# Patient Record
Sex: Male | Born: 1945
Health system: Southern US, Community
[De-identification: ages and names within clinical notes are randomized; demographics above are authoritative.]

## PROBLEM LIST (undated history)

## (undated) DIAGNOSIS — I10 Essential (primary) hypertension: Secondary | ICD-10-CM

## (undated) DIAGNOSIS — I2510 Atherosclerotic heart disease of native coronary artery without angina pectoris: Secondary | ICD-10-CM

## (undated) DIAGNOSIS — N529 Male erectile dysfunction, unspecified: Secondary | ICD-10-CM

## (undated) DIAGNOSIS — E785 Hyperlipidemia, unspecified: Secondary | ICD-10-CM

## (undated) DIAGNOSIS — L80 Vitiligo: Secondary | ICD-10-CM

## (undated) DIAGNOSIS — I219 Acute myocardial infarction, unspecified: Secondary | ICD-10-CM

## (undated) DIAGNOSIS — M1990 Unspecified osteoarthritis, unspecified site: Secondary | ICD-10-CM

## (undated) HISTORY — DX: Hyperlipidemia, unspecified: E78.5

## (undated) HISTORY — DX: Essential (primary) hypertension: I10

## (undated) HISTORY — DX: Acute myocardial infarction, unspecified: I21.9

## (undated) HISTORY — DX: Unspecified osteoarthritis, unspecified site: M19.90

## (undated) HISTORY — DX: Vitiligo: L80

## (undated) HISTORY — DX: Male erectile dysfunction, unspecified: N52.9

## (undated) HISTORY — DX: Atherosclerotic heart disease of native coronary artery without angina pectoris: I25.10

---

## 2001-02-11 LAB — HM COLONOSCOPY

## 2004-12-23 ENCOUNTER — Ambulatory Visit: Payer: Self-pay | Admitting: Family Medicine

## 2004-12-30 ENCOUNTER — Ambulatory Visit: Payer: Self-pay | Admitting: Family Medicine

## 2005-09-25 ENCOUNTER — Ambulatory Visit: Payer: Self-pay | Admitting: Family Medicine

## 2005-10-02 ENCOUNTER — Ambulatory Visit: Payer: Self-pay

## 2005-10-17 ENCOUNTER — Ambulatory Visit: Payer: Self-pay | Admitting: Family Medicine

## 2006-02-01 ENCOUNTER — Ambulatory Visit: Payer: Self-pay | Admitting: Family Medicine

## 2006-02-26 ENCOUNTER — Ambulatory Visit: Payer: Self-pay | Admitting: Family Medicine

## 2006-06-08 ENCOUNTER — Encounter: Admission: RE | Admit: 2006-06-08 | Discharge: 2006-06-08 | Payer: Self-pay | Admitting: Family Medicine

## 2006-06-08 ENCOUNTER — Ambulatory Visit: Payer: Self-pay | Admitting: Family Medicine

## 2006-06-08 LAB — CONVERTED CEMR LAB
Albumin: 4.2 g/dL (ref 3.5–5.2)
Chloride: 101 meq/L (ref 96–112)
Chol/HDL Ratio, serum: 4.1
Cholesterol: 139 mg/dL (ref 0–200)
GFR calc non Af Amer: 51 mL/min
HDL: 33.7 mg/dL — ABNORMAL LOW (ref >39.0)
PSA: 0.9 ng/mL (ref 0.10–4.00)
Potassium: 4.4 meq/L (ref 3.5–5.1)
Sodium: 139 meq/L (ref 135–145)
Total Bilirubin: 1.2 mg/dL (ref 0.3–1.2)
Total Protein: 6.9 g/dL (ref 6.0–8.3)
VLDL: 13 mg/dL (ref 0–40)

## 2006-07-03 DIAGNOSIS — I219 Acute myocardial infarction, unspecified: Secondary | ICD-10-CM

## 2006-07-03 HISTORY — DX: Acute myocardial infarction, unspecified: I21.9

## 2006-07-14 ENCOUNTER — Inpatient Hospital Stay (HOSPITAL_COMMUNITY): Admission: EM | Admit: 2006-07-14 | Discharge: 2006-07-20 | Payer: Self-pay | Admitting: Cardiology

## 2006-07-14 ENCOUNTER — Encounter: Payer: Self-pay | Admitting: Emergency Medicine

## 2006-07-14 ENCOUNTER — Ambulatory Visit: Payer: Self-pay | Admitting: Cardiology

## 2006-08-08 ENCOUNTER — Ambulatory Visit: Payer: Self-pay | Admitting: Cardiovascular Disease

## 2006-08-17 ENCOUNTER — Encounter (HOSPITAL_COMMUNITY): Admission: RE | Admit: 2006-08-17 | Discharge: 2006-09-28 | Payer: Self-pay | Admitting: Cardiovascular Disease

## 2006-08-31 ENCOUNTER — Ambulatory Visit: Payer: Self-pay | Admitting: Thoracic Surgery (Cardiothoracic Vascular Surgery)

## 2006-09-25 ENCOUNTER — Ambulatory Visit: Payer: Self-pay | Admitting: Cardiovascular Disease

## 2006-09-25 LAB — CONVERTED CEMR LAB
ALT: 20 units/L (ref 0–40)
AST: 25 units/L (ref 0–37)
Alkaline Phosphatase: 56 units/L (ref 39–117)
Bilirubin, Direct: 0.1 mg/dL (ref 0.0–0.3)
Total Bilirubin: 0.9 mg/dL (ref 0.3–1.2)
VLDL: 11 mg/dL (ref 0–40)

## 2006-10-09 ENCOUNTER — Ambulatory Visit: Payer: Self-pay | Admitting: Cardiovascular Disease

## 2006-11-14 ENCOUNTER — Ambulatory Visit: Payer: Self-pay | Admitting: Cardiovascular Disease

## 2007-02-18 DIAGNOSIS — M109 Gout, unspecified: Secondary | ICD-10-CM

## 2007-02-18 DIAGNOSIS — E785 Hyperlipidemia, unspecified: Secondary | ICD-10-CM

## 2007-02-18 DIAGNOSIS — E119 Type 2 diabetes mellitus without complications: Secondary | ICD-10-CM

## 2007-02-18 DIAGNOSIS — I10 Essential (primary) hypertension: Secondary | ICD-10-CM

## 2007-03-26 ENCOUNTER — Ambulatory Visit: Payer: Self-pay | Admitting: Cardiovascular Disease

## 2007-03-26 LAB — CONVERTED CEMR LAB
ALT: 23 units/L (ref 0–53)
AST: 22 units/L (ref 0–37)
Alkaline Phosphatase: 49 units/L (ref 39–117)
Bilirubin, Direct: 0.2 mg/dL (ref 0.0–0.3)
Cholesterol: 108 mg/dL (ref 0–200)
Total CHOL/HDL Ratio: 3.4
Total Protein: 6.7 g/dL (ref 6.0–8.3)
VLDL: 10 mg/dL (ref 0–40)

## 2007-04-03 ENCOUNTER — Ambulatory Visit: Payer: Self-pay | Admitting: Cardiovascular Disease

## 2007-04-04 ENCOUNTER — Telehealth: Payer: Self-pay | Admitting: Family Medicine

## 2007-05-10 ENCOUNTER — Ambulatory Visit: Payer: Self-pay | Admitting: Family Medicine

## 2007-05-10 DIAGNOSIS — N401 Enlarged prostate with lower urinary tract symptoms: Secondary | ICD-10-CM

## 2007-05-10 DIAGNOSIS — I2510 Atherosclerotic heart disease of native coronary artery without angina pectoris: Secondary | ICD-10-CM | POA: Insufficient documentation

## 2007-05-10 DIAGNOSIS — N138 Other obstructive and reflux uropathy: Secondary | ICD-10-CM

## 2007-05-10 LAB — CONVERTED CEMR LAB
Specific Gravity, Urine: 1.025
Urobilinogen, UA: 1
WBC Urine, dipstick: NEGATIVE
pH: 5.5

## 2007-05-13 LAB — CONVERTED CEMR LAB
ALT: 20 units/L (ref 0–53)
AST: 24 units/L (ref 0–37)
Albumin: 4.4 g/dL (ref 3.5–5.2)
Alkaline Phosphatase: 57 units/L (ref 39–117)
BUN: 22 mg/dL (ref 6–23)
Basophils Absolute: 0 10*3/uL (ref 0.0–0.1)
Basophils Relative: 0.4 % (ref 0.0–1.0)
CO2: 30 meq/L (ref 19–32)
Calcium: 9.9 mg/dL (ref 8.4–10.5)
Cholesterol: 115 mg/dL (ref 0–200)
Creatinine, Ser: 1.3 mg/dL (ref 0.4–1.5)
Creatinine,U: 300 mg/dL
Eosinophils Absolute: 0.3 10*3/uL (ref 0.0–0.6)
HCT: 42.8 % (ref 39.0–52.0)
Hemoglobin: 15.1 g/dL (ref 13.0–17.0)
Hgb A1c MFr Bld: 5.5 % (ref 4.6–6.0)
LDL Cholesterol: 72 mg/dL (ref 0–99)
Lymphocytes Relative: 17.4 % (ref 12.0–46.0)
Microalb Creat Ratio: 3.3 mg/g (ref 0.0–30.0)
Microalb, Ur: 1 mg/dL (ref 0.0–1.9)
Monocytes Absolute: 0.6 10*3/uL (ref 0.2–0.7)
Neutro Abs: 4.6 10*3/uL (ref 1.4–7.7)
Platelets: 216 10*3/uL (ref 150–400)
RDW: 14.5 % (ref 11.5–14.6)
Total Bilirubin: 1.1 mg/dL (ref 0.3–1.2)
Total CHOL/HDL Ratio: 3.7

## 2007-07-04 HISTORY — PX: OTHER SURGICAL HISTORY: SHX169

## 2007-11-06 ENCOUNTER — Ambulatory Visit: Payer: Self-pay | Admitting: Cardiovascular Disease

## 2008-03-13 ENCOUNTER — Ambulatory Visit: Payer: Self-pay | Admitting: Cardiovascular Disease

## 2008-03-13 LAB — CONVERTED CEMR LAB
ALT: 18 units/L (ref 0–53)
AST: 21 units/L (ref 0–37)
Bilirubin, Direct: 0.2 mg/dL (ref 0.0–0.3)
Cholesterol: 107 mg/dL (ref 0–200)
Total Bilirubin: 1.1 mg/dL (ref 0.3–1.2)
Total CHOL/HDL Ratio: 4.3
VLDL: 14 mg/dL (ref 0–40)

## 2008-05-29 IMAGING — CR DG CHEST 2V
2 series · 2 of 2 positions shown · non-contrast
Comparison: 07/17/06.

CLINICAL DATA: CABG.
 CHEST - 2 VIEW:

[w chest pa]
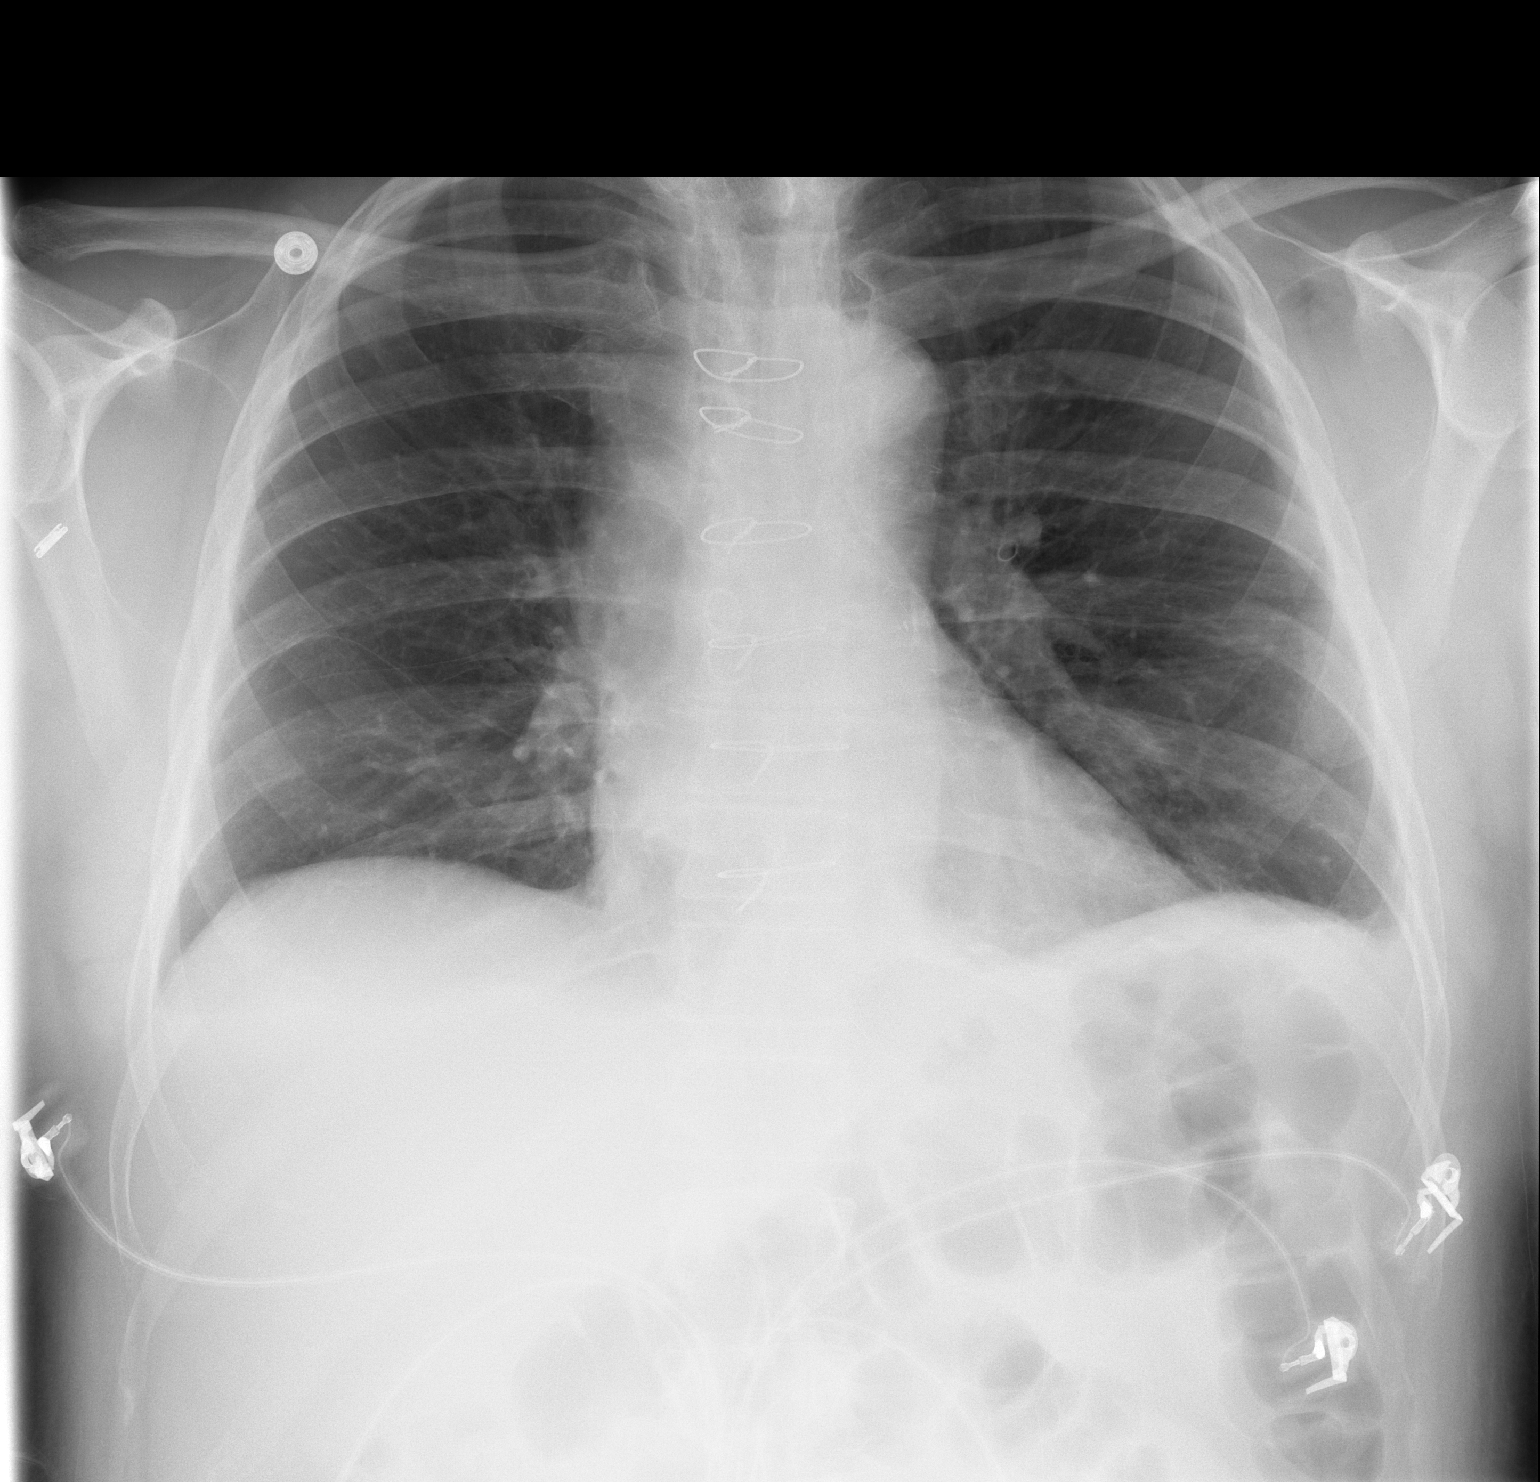

[w chest lat]
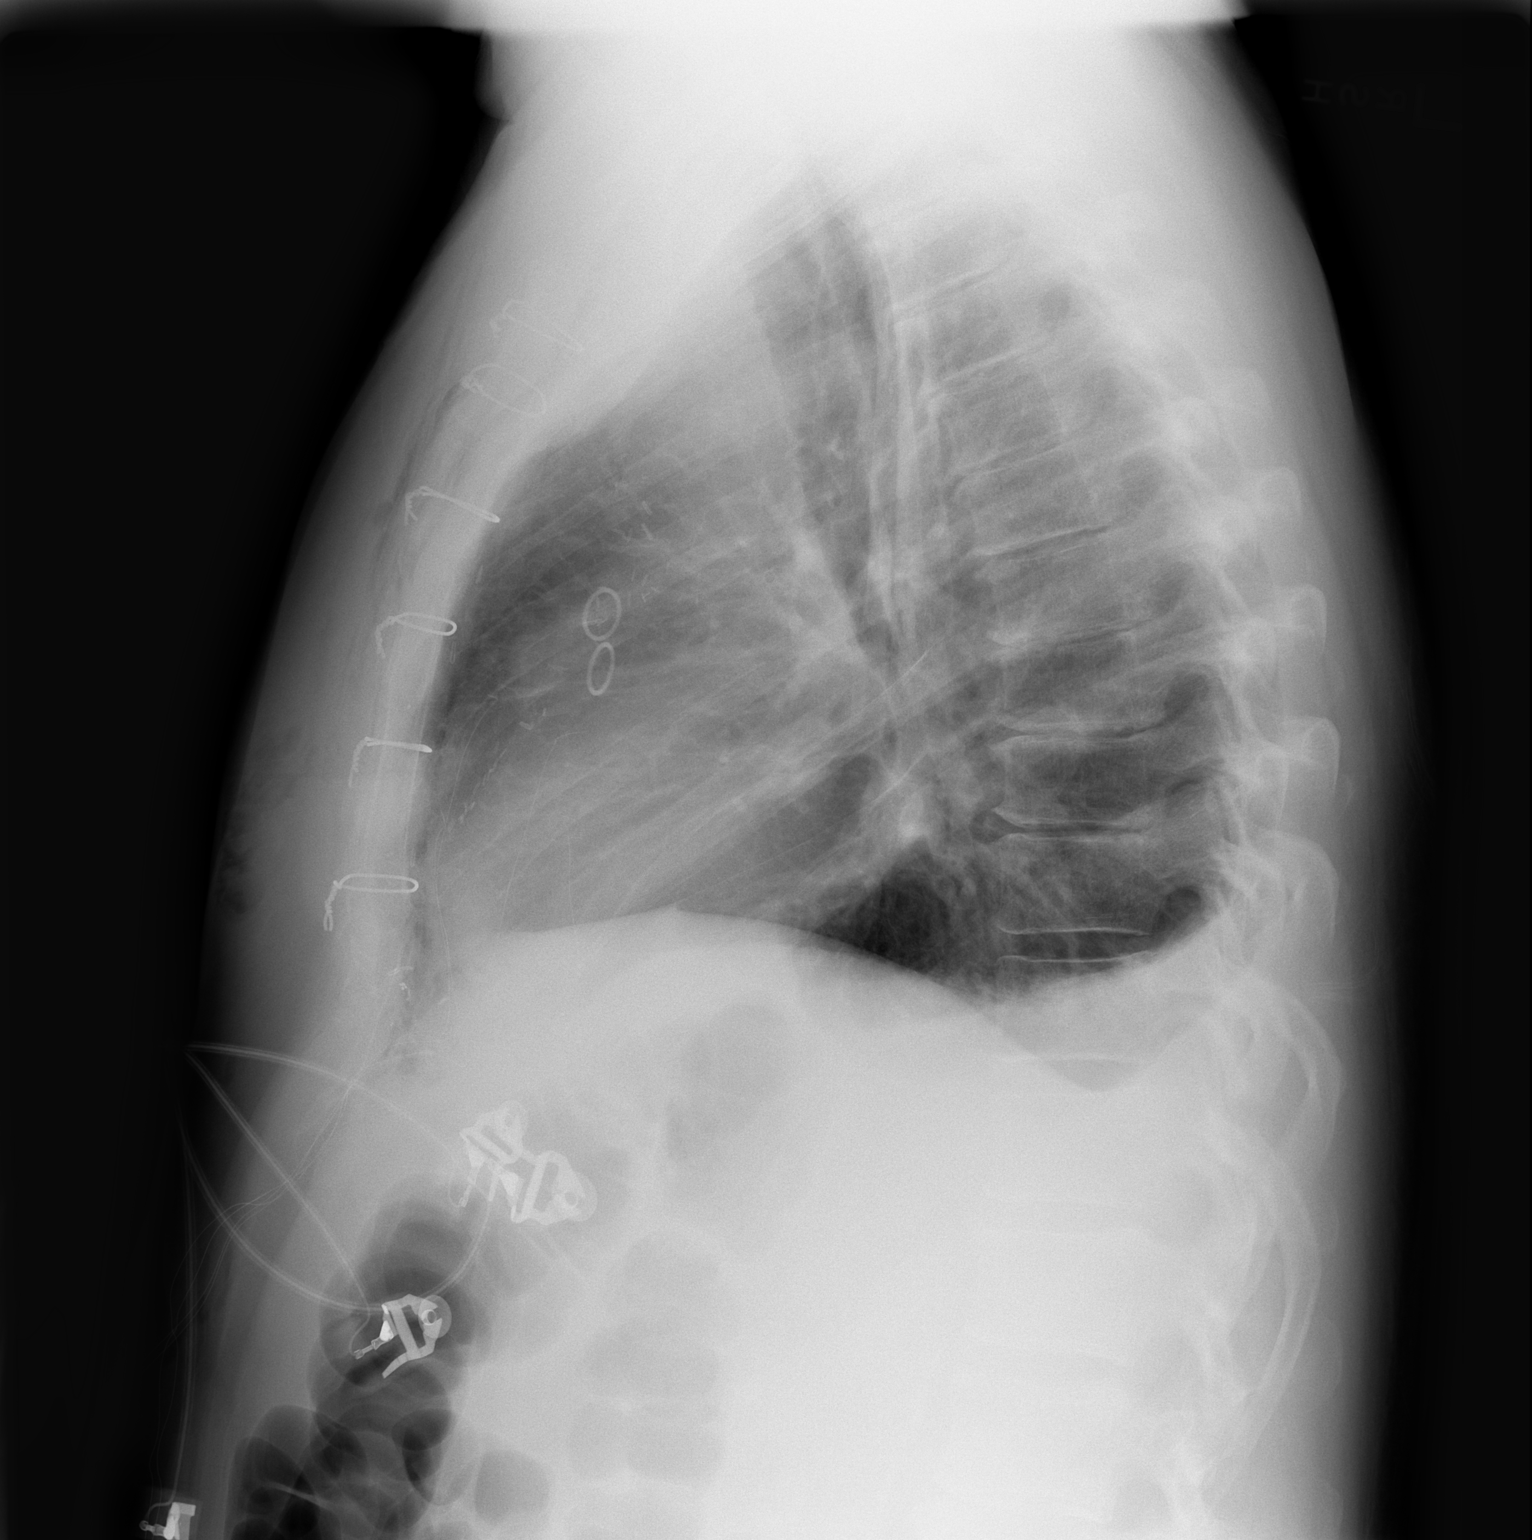

[2 of 2 positions shown; findings below may reference images not displayed]

FINDINGS: The right central venous line and left chest tube have been removed. Small effusions remain with mild basilar atelectasis. No pneumothorax is seen.  A minimal amount of chest wall subcutaneous air is present.
IMPRESSION: Swan-Ganz catheter and left chest tube removed. No pneumothorax. Small effusions.

## 2008-07-10 ENCOUNTER — Ambulatory Visit: Payer: Self-pay | Admitting: Family Medicine

## 2008-07-10 LAB — CONVERTED CEMR LAB
Glucose, Urine, Semiquant: NEGATIVE
Nitrite: NEGATIVE
Protein, U semiquant: NEGATIVE
Specific Gravity, Urine: 1.02
Urobilinogen, UA: 0.2

## 2008-07-14 LAB — CONVERTED CEMR LAB
ALT: 19 units/L (ref 0–53)
AST: 23 units/L (ref 0–37)
Albumin: 4.3 g/dL (ref 3.5–5.2)
Alkaline Phosphatase: 53 units/L (ref 39–117)
Basophils Relative: 0 % (ref 0.0–3.0)
Chloride: 107 meq/L (ref 96–112)
Eosinophils Absolute: 0.2 10*3/uL (ref 0.0–0.7)
Eosinophils Relative: 2.8 % (ref 0.0–5.0)
GFR calc Af Amer: 79 mL/min
GFR calc non Af Amer: 65 mL/min
HCT: 43.4 % (ref 39.0–52.0)
Hemoglobin: 15.3 g/dL (ref 13.0–17.0)
LDL Cholesterol: 64 mg/dL (ref 0–99)
MCV: 84.3 fL (ref 78.0–100.0)
Neutro Abs: 4.7 10*3/uL (ref 1.4–7.7)
Potassium: 5.2 meq/L — ABNORMAL HIGH (ref 3.5–5.1)
RDW: 13.8 % (ref 11.5–14.6)
Sodium: 144 meq/L (ref 135–145)
Total Bilirubin: 1.3 mg/dL — ABNORMAL HIGH (ref 0.3–1.2)
Total Protein: 7.5 g/dL (ref 6.0–8.3)
Triglycerides: 86 mg/dL (ref 0–149)
VLDL: 17 mg/dL (ref 0–40)
WBC: 6.7 10*3/uL (ref 4.5–10.5)

## 2008-07-17 ENCOUNTER — Ambulatory Visit: Payer: Self-pay | Admitting: Family Medicine

## 2008-07-17 DIAGNOSIS — I252 Old myocardial infarction: Secondary | ICD-10-CM

## 2008-10-05 ENCOUNTER — Telehealth: Payer: Self-pay | Admitting: Family Medicine

## 2008-10-16 ENCOUNTER — Ambulatory Visit: Payer: Self-pay | Admitting: Family Medicine

## 2008-10-19 ENCOUNTER — Telehealth: Payer: Self-pay | Admitting: Cardiovascular Disease

## 2008-10-20 ENCOUNTER — Encounter: Payer: Self-pay | Admitting: Cardiovascular Disease

## 2008-10-22 ENCOUNTER — Telehealth: Payer: Self-pay | Admitting: Cardiovascular Disease

## 2008-12-02 ENCOUNTER — Telehealth: Payer: Self-pay | Admitting: Family Medicine

## 2009-02-18 ENCOUNTER — Ambulatory Visit: Payer: Self-pay | Admitting: Cardiovascular Disease

## 2009-03-15 ENCOUNTER — Ambulatory Visit: Payer: Self-pay

## 2009-03-15 ENCOUNTER — Ambulatory Visit: Payer: Self-pay | Admitting: Cardiovascular Disease

## 2009-06-08 ENCOUNTER — Telehealth (INDEPENDENT_AMBULATORY_CARE_PROVIDER_SITE_OTHER): Payer: Self-pay | Admitting: *Deleted

## 2009-07-28 ENCOUNTER — Ambulatory Visit: Payer: Self-pay | Admitting: Family Medicine

## 2009-07-29 LAB — CONVERTED CEMR LAB
AST: 25 units/L (ref 0–37)
Alkaline Phosphatase: 46 units/L (ref 39–117)
BUN: 18 mg/dL (ref 6–23)
Basophils Absolute: 0 10*3/uL (ref 0.0–0.1)
Bilirubin, Direct: 0.1 mg/dL (ref 0.0–0.3)
CO2: 28 meq/L (ref 19–32)
Cholesterol: 109 mg/dL (ref 0–200)
Eosinophils Absolute: 0.2 10*3/uL (ref 0.0–0.7)
GFR calc non Af Amer: 64.78 mL/min (ref >60)
HCT: 44.8 % (ref 39.0–52.0)
Hemoglobin: 14.6 g/dL (ref 13.0–17.0)
Lymphs Abs: 1.2 10*3/uL (ref 0.7–4.0)
Microalb Creat Ratio: 2.4 mg/g (ref 0.0–30.0)
Microalb, Ur: 0.4 mg/dL (ref 0.0–1.9)
Monocytes Absolute: 0.5 10*3/uL (ref 0.1–1.0)
Monocytes Relative: 10 % (ref 3.0–12.0)
Nitrite: NEGATIVE
PSA: 0.74 ng/mL (ref 0.10–4.00)
Potassium: 4.2 meq/L (ref 3.5–5.1)
RBC: 5.05 M/uL (ref 4.22–5.81)
Specific Gravity, Urine: 1.015
TSH: 2.58 microintl units/mL (ref 0.35–5.50)
Total Bilirubin: 0.9 mg/dL (ref 0.3–1.2)
Triglycerides: 72 mg/dL (ref 0.0–149.0)
Urobilinogen, UA: 0.2
VLDL: 14.4 mg/dL (ref 0.0–40.0)
WBC: 4.9 10*3/uL (ref 4.5–10.5)
pH: 5.5

## 2009-08-04 ENCOUNTER — Ambulatory Visit: Payer: Self-pay | Admitting: Family Medicine

## 2009-08-04 DIAGNOSIS — R3129 Other microscopic hematuria: Secondary | ICD-10-CM

## 2009-09-14 ENCOUNTER — Encounter: Payer: Self-pay | Admitting: Family Medicine

## 2009-10-06 ENCOUNTER — Encounter: Payer: Self-pay | Admitting: Family Medicine

## 2009-11-09 ENCOUNTER — Ambulatory Visit: Payer: Self-pay | Admitting: Family Medicine

## 2010-01-24 ENCOUNTER — Telehealth: Payer: Self-pay | Admitting: Cardiovascular Disease

## 2010-02-16 ENCOUNTER — Ambulatory Visit: Payer: Self-pay | Admitting: Family Medicine

## 2010-02-16 DIAGNOSIS — B360 Pityriasis versicolor: Secondary | ICD-10-CM

## 2010-02-22 ENCOUNTER — Ambulatory Visit: Payer: Self-pay | Admitting: Cardiovascular Disease

## 2010-08-02 NOTE — Letter (Signed)
Summary: [facility]  [facility]   Imported By: Maryln Gottron 10/13/2009 09:36:54  _____________________________________________________________________  External Attachment:    Type:   Image     Comment:   External Document

## 2010-08-02 NOTE — Assessment & Plan Note (Signed)
Summary: f1y   Visit Type:  1 year follow up Primary Provider:  Nelwyn Salisbury MD  CC:  No cardiac complaints.  History of Present Illness: 65 year-old with CAD s/p CABG 2007. He presented with ACS and was found to have critical left main stenosis. He has done very well since CABG. He continues with regular exercise and no exertional symptoms. Specifically denies chest pain, dyspnea, or edema. Tolerating medical therapy well.  Current Medications (verified): 1)  Naproxen Dr 500 Mg  Tbec (Naproxen) .... Two Times A Day As Needed For Pain 2)  Crestor 10 Mg Tabs (Rosuvastatin Calcium) .... Take 1 Tablet By Mouth Once A Day 3)  Metoprolol Tartrate 50 Mg Tabs (Metoprolol Tartrate) .Marland Kitchen.. 1 By Mouth Two Times A Day 4)  Cialis 20 Mg Tabs (Tadalafil) .... As Needed 5)  Allopurinol 300 Mg Tabs (Allopurinol) .... Take 1 Tab Every Day 6)  Centrum  Tabs (Multiple Vitamins-Minerals) .... Take 1 Tablet By Mouth Once A Day 7)  Aspirin 81 Mg Tbec (Aspirin) .... Take One Tablet By Mouth Daily 8)  Indomethacin 50 Mg Caps (Indomethacin) .Marland Kitchen.. 1 Q 6 Hours As Needed Gout 9)  Ketoconazole 200 Mg Tabs (Ketoconazole) .... Two Times A Day For 1 Month  Allergies (verified): No Known Drug Allergies  Past History:  Past medical history reviewed for relevance to current acute and chronic problems.  Past Medical History: Reviewed history from 02/21/2010 and no changes required.  Coronary artery disease status post CABG.  New right bundle branch   block noted but no symptomatic concerns for ischemia. DJD - Neck Gout Hyperlipidemia Diabetes mellitus, type II Hypertension Erectile Dysfunction Myocardial infarction, hx of, 1-08 vitiligo normal treadmill 03-15-09  Review of Systems       Negative except as per HPI   Vital Signs:  Patient profile:   65 year old male Height:      69 inches Weight:      196.25 pounds BMI:     29.09 Pulse rate:   56 / minute Pulse rhythm:   regular Resp:     18 per  minute BP sitting:   132 / 70  (left arm) Cuff size:   large  Vitals Entered By: Vikki Ports (February 22, 2010 11:58 AM)  Physical Exam  General:  Pt is alert and oriented, in no acute distress. HEENT: normal Neck: normal carotid upstrokes without bruits, JVP normal Lungs: CTA CV: RRR without murmur or gallop Abd: soft, NT, positive BS, no bruit, no organomegaly Ext: no clubbing, cyanosis, or edema. peripheral pulses 2+ and equal Skin: warm and dry without rash    EKG  Procedure date:  02/22/2010  Findings:      Sinus brady 56 bpm, RBBB, unchanged from previous  Impression & Recommendations:  Problem # 1:  CORONARY ARTERY DISEASE (ICD-414.00) Stable without angina. Reviewed exercise tolerance test from last year and he had no angina or ST changes at over 10 mets.  His updated medication list for this problem includes:    Metoprolol Tartrate 50 Mg Tabs (Metoprolol tartrate) .Marland Kitchen... 1 by mouth two times a day    Aspirin 81 Mg Tbec (Aspirin) .Marland Kitchen... Take one tablet by mouth daily  Orders: EKG w/ Interpretation (93000)  Problem # 2:  HYPERLIPIDEMIA (ICD-272.4) Lipids at goal as below.  His updated medication list for this problem includes:    Crestor 10 Mg Tabs (Rosuvastatin calcium) .Marland Kitchen... Take 1 tablet by mouth once a day  CHOL: 109 (07/28/2009)  LDL: 54 (07/28/2009)   HDL: 41.00 (07/28/2009)   TG: 72.0 (07/28/2009)  Problem # 3:  HYPERTENSION (ICD-401.9) Controlled.  His updated medication list for this problem includes:    Metoprolol Tartrate 50 Mg Tabs (Metoprolol tartrate) .Marland Kitchen... 1 by mouth two times a day    Aspirin 81 Mg Tbec (Aspirin) .Marland Kitchen... Take one tablet by mouth daily  BP today: 132/70 Prior BP: 106/68 (02/16/2010)  Prior 10 Yr Risk Heart Disease: N/A (03/15/2009)  Labs Reviewed: K+: 4.2 (07/28/2009) Creat: : 1.2 (07/28/2009)   Chol: 109 (07/28/2009)   HDL: 41.00 (07/28/2009)   LDL: 54 (07/28/2009)   TG: 72.0 (07/28/2009)  Patient Instructions: 1)   Your physician recommends that you continue on your current medications as directed. Please refer to the Current Medication list given to you today. 2)  Your physician wants you to follow-up in:  1 YEAR.  You will receive a reminder letter in the mail two months in advance. If you don't receive a letter, please call our office to schedule the follow-up appointment.

## 2010-08-02 NOTE — Assessment & Plan Note (Signed)
Summary: CPX/NJR   Vital Signs:  Patient profile:   65 year old male Height:      69 inches Weight:      198 pounds BMI:     29.35 Temp:     97.9 degrees F oral Pulse rate:   58 / minute BP sitting:   112 / 80  (left arm) Cuff size:   large  Vitals Entered By: Alfred Levins, CMA (August 04, 2009 1:33 PM) CC: cpx   History of Present Illness: 65 yr old male for cpx. He feels fine and he has no concerns. He is still active and exercises regularly. No gout attacks in the past year. He plans to go on a [place] cruise in a few weeks.   Current Medications (verified): 1)  Naproxen Dr 500 Mg  Tbec (Naproxen) .... Two Times A Day As Needed 2)  Crestor 10 Mg Tabs (Rosuvastatin Calcium) .... Take 1 Tablet By Mouth Once A Day 3)  Metoprolol Tartrate 50 Mg Tabs (Metoprolol Tartrate) .Marland Kitchen.. 1 By Mouth Two Times A Day 4)  Cialis 20 Mg Tabs (Tadalafil) .... As Needed 5)  Allopurinol 300 Mg Tabs (Allopurinol) .... Take 1 Tab Every Day 6)  Centrum  Tabs (Multiple Vitamins-Minerals) .... Take 1 Tablet By Mouth Once A Day 7)  Aspirin 81 Mg Tbec (Aspirin) .... Take One Tablet By Mouth Daily  Allergies (verified): No Known Drug Allergies  Past History:  Past Medical History: DJD - Neck Gout Hyperlipidemia Diabetes mellitus, type II Hypertension Erectile Dysfunction Coronary artery disease (sees Dr. Excell Seltzer) Myocardial infarction, hx of, 1-08 vitiligo normal treadmill 03-15-09  Past Surgical History: Rotator cuff repair Triple cardiac Bypass 1-08 colonoscopy 02-11-01 per Dr. Russella Dar, repeat 10 yrs  Family History: Reviewed history from 09/16/2008 and no changes required. Family History of Alcoholism/Addiction Family History Hypertension Family History Lung cancer Family History of Stroke M 1st degree relative <50 Family History of Cardiovascular disorder   His mother died at 59 from congestive heart failure,  unclear whether she had a history of myocardial infarction.  Father  died  at age 67 from lung cancer but reportedly had myocardial infarction at  age 67.  Social History: Reviewed history from 02/18/2007 and no changes required. Never Smoked Alcohol use-yes Drug use-no Married  Review of Systems  The patient denies anorexia, fever, weight loss, weight gain, vision loss, decreased hearing, hoarseness, chest pain, syncope, dyspnea on exertion, peripheral edema, prolonged cough, headaches, hemoptysis, abdominal pain, melena, hematochezia, severe indigestion/heartburn, hematuria, incontinence, genital sores, muscle weakness, suspicious skin lesions, transient blindness, difficulty walking, depression, unusual weight change, abnormal bleeding, enlarged lymph nodes, angioedema, breast masses, and testicular masses.    Physical Exam  General:  Well-developed,well-nourished,in no acute distress; alert,appropriate and cooperative throughout examination Head:  Normocephalic and atraumatic without obvious abnormalities. No apparent alopecia or balding. Eyes:  No corneal or conjunctival inflammation noted. EOMI. Perrla. Funduscopic exam benign, without hemorrhages, exudates or papilledema. Vision grossly normal. Ears:  External ear exam shows no significant lesions or deformities.  Otoscopic examination reveals clear canals, tympanic membranes are intact bilaterally without bulging, retraction, inflammation or discharge. Hearing is grossly normal bilaterally. Nose:  External nasal examination shows no deformity or inflammation. Nasal mucosa are pink and moist without lesions or exudates. Mouth:  Oral mucosa and oropharynx without lesions or exudates.  Teeth in good repair. Neck:  No deformities, masses, or tenderness noted. Chest Wall:  No deformities, masses, tenderness or gynecomastia noted. Lungs:  Normal respiratory effort, chest  expands symmetrically. Lungs are clear to auscultation, no crackles or wheezes. Heart:  Normal rate and regular rhythm. S1 and S2 normal  without gallop, murmur, click, rub or other extra sounds. EKG stable with RBBB. Abdomen:  Bowel sounds positive,abdomen soft and non-tender without masses, organomegaly or hernias noted. Rectal:  No external abnormalities noted. Normal sphincter tone. No rectal masses or tenderness. Heme neg Genitalia:  Testes bilaterally descended without nodularity, tenderness or masses. No scrotal masses or lesions. No penis lesions or urethral discharge. Prostate:  Prostate gland firm and smooth, no enlargement, nodularity, tenderness, mass, asymmetry or induration. Msk:  No deformity or scoliosis noted of thoracic or lumbar spine.   Pulses:  R and L carotid,radial,femoral,dorsalis pedis and posterior tibial pulses are full and equal bilaterally Extremities:  No clubbing, cyanosis, edema, or deformity noted with normal full range of motion of all joints.   Neurologic:  No cranial nerve deficits noted. Station and gait are normal. Plantar reflexes are down-going bilaterally. DTRs are symmetrical throughout. Sensory, motor and coordinative functions appear intact. Skin:  Intact without suspicious lesions or rashes Cervical Nodes:  No lymphadenopathy noted Axillary Nodes:  No palpable lymphadenopathy Inguinal Nodes:  No significant adenopathy Psych:  Cognition and judgment appear intact. Alert and cooperative with normal attention span and concentration. No apparent delusions, illusions, hallucinations   Impression & Recommendations:  Problem # 1:  WELL ADULT EXAM (ICD-V70.0)  Orders: Hemoccult Guaiac-1 spec.(in office) (82270) EKG w/ Interpretation (93000)  Problem # 2:  MICROSCOPIC HEMATURIA (ICD-599.72)  Complete Medication List: 1)  Naproxen Dr 500 Mg Tbec (Naproxen) .... Two times a day as needed 2)  Crestor 10 Mg Tabs (Rosuvastatin calcium) .... Take 1 tablet by mouth once a day 3)  Metoprolol Tartrate 50 Mg Tabs (Metoprolol tartrate) .Marland Kitchen.. 1 by mouth two times a day 4)  Cialis 20 Mg Tabs  (Tadalafil) .... As needed 5)  Allopurinol 300 Mg Tabs (Allopurinol) .... Take 1 tab every day 6)  Centrum Tabs (Multiple vitamins-minerals) .... Take 1 tablet by mouth once a day 7)  Aspirin 81 Mg Tbec (Aspirin) .... Take one tablet by mouth daily  Patient Instructions: 1)  He seems to be doing quite well. He will see Urology to evaluate the hematuria.  2)  Please schedule a follow-up appointment in 6 months .  Prescriptions: CIALIS 20 MG TABS (TADALAFIL) as needed  #10 x 11   Entered and Authorized by:   Nelwyn Salisbury MD   Signed by:   Nelwyn Salisbury MD on 08/04/2009   Method used:   Electronically to        [facility]  [phone]* (retail)       [address] [place], [place]  [address]       Ph: [phone] or [phone]       Fax: [phone]   RxID:   [id number]   Preventive Care Screening  Colonoscopy:    Date:  07/04/1999    Next Due:  07/2009    Results:  normal   Last Flu Shot:    Date:  06/02/2009    Results:  given

## 2010-08-02 NOTE — Assessment & Plan Note (Signed)
Summary: RASH ACROSS CHEST // RS   Vital Signs:  Patient profile:   65 year old male Weight:      192 pounds BMI:     28.46 Temp:     98.1 degrees F oral BP sitting:   106 / 68  (left arm) Cuff size:   regular  Vitals Entered By: Raechel Ache, RN (February 16, 2010 9:30 AM) CC: C/o rash on chest x 1 month.   History of Present Illness: here for one month of an asymptomatic rash on the trunk  Allergies (verified): No Known Drug Allergies  Past History:  Past Medical History: Reviewed history from 08/04/2009 and no changes required. DJD - Neck Gout Hyperlipidemia Diabetes mellitus, type II Hypertension Erectile Dysfunction Coronary artery disease (sees Dr. Excell Seltzer) Myocardial infarction, hx of, 1-08 vitiligo normal treadmill 03-15-09  Review of Systems  The patient denies anorexia, fever, weight loss, weight gain, vision loss, decreased hearing, hoarseness, chest pain, syncope, dyspnea on exertion, peripheral edema, prolonged cough, headaches, hemoptysis, abdominal pain, melena, hematochezia, severe indigestion/heartburn, hematuria, incontinence, genital sores, muscle weakness, suspicious skin lesions, transient blindness, difficulty walking, depression, unusual weight change, abnormal bleeding, enlarged lymph nodes, angioedema, breast masses, and testicular masses.    Physical Exam  General:  Well-developed,well-nourished,in no acute distress; alert,appropriate and cooperative throughout examination Skin:  scattered patches of hypopigmented skin around the trunk   Impression & Recommendations:  Problem # 1:  TINEA VERSICOLOR (ICD-111.0)  His updated medication list for this problem includes:    Ketoconazole 200 Mg Tabs (Ketoconazole) .Marland Kitchen..Marland Kitchen Two times a day  Complete Medication List: 1)  Naproxen Dr 500 Mg Tbec (Naproxen) .... Two times a day as needed for pain 2)  Crestor 10 Mg Tabs (Rosuvastatin calcium) .... Take 1 tablet by mouth once a day 3)  Metoprolol  Tartrate 50 Mg Tabs (Metoprolol tartrate) .Marland Kitchen.. 1 by mouth two times a day 4)  Cialis 20 Mg Tabs (Tadalafil) .... As needed 5)  Allopurinol 300 Mg Tabs (Allopurinol) .... Take 1 tab every day 6)  Centrum Tabs (Multiple vitamins-minerals) .... Take 1 tablet by mouth once a day 7)  Aspirin 81 Mg Tbec (Aspirin) .... Take one tablet by mouth daily 8)  Indomethacin 50 Mg Caps (Indomethacin) .Marland Kitchen.. 1 q 6 hours as needed gout 9)  Ketoconazole 200 Mg Tabs (Ketoconazole) .... Two times a day  Patient Instructions: 1)  Add topical Selsun Blue.  2)  Please schedule a follow-up appointment as needed .  Prescriptions: KETOCONAZOLE 200 MG TABS (KETOCONAZOLE) two times a day  #60 x 0   Entered and Authorized by:   Nelwyn Salisbury MD   Signed by:   Nelwyn Salisbury MD on 02/16/2010   Method used:   Electronically to        [facility]  [phone]* (retail)       [address] [place], [place]  [address]       Ph: [phone] or [phone]       Fax: [phone]   RxID:   [id number]

## 2010-08-02 NOTE — Letter (Signed)
Summary: [facility]  [facility]   Imported By: Maryln Gottron 09/17/2009 12:44:31  _____________________________________________________________________  External Attachment:    Type:   Image     Comment:   External Document

## 2010-08-02 NOTE — Assessment & Plan Note (Signed)
Summary: knee pain/njr   Vital Signs:  Patient profile:   65 year old male Weight:      198 pounds BMI:     29.35 BP sitting:   120 / 80  (left arm) Cuff size:   regular  Vitals Entered By: Raechel Ache, RN (Nov 09, 2009 8:30 AM) CC: C/o L knee pain x 10 days   History of Present Illness: Here for swelling and pain in the left knee which came up suddenly about 10 days ago. He woke up with it one morning, no hx of trauma. It was hot and red at first. The worst swelling was in the first 5 days, and now it feels muxh better. Using Naprosyn and ice. He usually gets gout in the toes, and only once has he had it in the knees.  Allergies (verified): No Known Drug Allergies  Past History:  Past Medical History: Reviewed history from 08/04/2009 and no changes required. DJD - Neck Gout Hyperlipidemia Diabetes mellitus, type II Hypertension Erectile Dysfunction Coronary artery disease (sees Dr. Excell Seltzer) Myocardial infarction, hx of, 1-08 vitiligo normal treadmill 03-15-09  Past Surgical History: Reviewed history from 08/04/2009 and no changes required. Rotator cuff repair Triple cardiac Bypass 1-08 colonoscopy 02-11-01 per Dr. Russella Dar, repeat 10 yrs  Review of Systems  The patient denies anorexia, fever, weight loss, weight gain, vision loss, decreased hearing, hoarseness, chest pain, syncope, dyspnea on exertion, peripheral edema, prolonged cough, headaches, hemoptysis, abdominal pain, melena, hematochezia, severe indigestion/heartburn, hematuria, incontinence, genital sores, muscle weakness, suspicious skin lesions, transient blindness, difficulty walking, depression, unusual weight change, abnormal bleeding, enlarged lymph nodes, angioedema, breast masses, and testicular masses.    Physical Exam  General:  Well-developed,well-nourished,in no acute distress; alert,appropriate and cooperative throughout examination Msk:  left knee is mildly swollen, not red or warm. Full ROM. Very  tender over the lateral joint space   Impression & Recommendations:  Problem # 1:  GOUT (ICD-274.9)  His updated medication list for this problem includes:    Naproxen Dr 500 Mg Tbec (Naproxen) .Marland Kitchen..Marland Kitchen Two times a day as needed for pain    Allopurinol 300 Mg Tabs (Allopurinol) .Marland Kitchen... Take 1 tab every day    Indomethacin 50 Mg Caps (Indomethacin) .Marland Kitchen... 1 q 6 hours as needed gout  Complete Medication List: 1)  Naproxen Dr 500 Mg Tbec (Naproxen) .... Two times a day as needed for pain 2)  Crestor 10 Mg Tabs (Rosuvastatin calcium) .... Take 1 tablet by mouth once a day 3)  Metoprolol Tartrate 50 Mg Tabs (Metoprolol tartrate) .Marland Kitchen.. 1 by mouth two times a day 4)  Cialis 20 Mg Tabs (Tadalafil) .... As needed 5)  Allopurinol 300 Mg Tabs (Allopurinol) .... Take 1 tab every day 6)  Centrum Tabs (Multiple vitamins-minerals) .... Take 1 tablet by mouth once a day 7)  Aspirin 81 Mg Tbec (Aspirin) .... Take one tablet by mouth daily 8)  Indomethacin 50 Mg Caps (Indomethacin) .Marland Kitchen.. 1 q 6 hours as needed gout  Patient Instructions: 1)  Please schedule a follow-up appointment as needed .  Prescriptions: INDOMETHACIN 50 MG CAPS (INDOMETHACIN) 1 q 6 hours as needed gout  #60 x 5   Entered and Authorized by:   Nelwyn Salisbury MD   Signed by:   Nelwyn Salisbury MD on 11/09/2009   Method used:   [facility] [address]  [phone][facility] (retail)       [address]  [place], [place]  [address]       Ph: [phone] or [phone]       Fax: [phone]   RxID:   [id number] NAPROXEN DR 500 MG  TBEC (NAPROXEN) two times a day as needed for pain  #60 x 11   Entered and Authorized by:   Nelwyn Salisbury MD   Signed by:   Nelwyn Salisbury MD on 11/09/2009   Method used:   [facility] to        [facility]  [phone]* (retail)       [address] [place], [place]  [address]       Ph: [phone] or [phone]       Fax: [phone]   RxID:   [id number]

## 2010-08-02 NOTE — Progress Notes (Signed)
Summary: pt needs refill  Phone Note Refill Request Call back at Home Phone [phone] Message from:  Patient on [facility]  Refills Requested: Medication #1:  METOPROLOL TARTRATE 50 MG TABS 1 by mouth two times a day  Medication #2:  CRESTOR 10 MG TABS Take 1 tablet by mouth once a day Initial call taken by: Omer Jack,  January 24, 2010 1:35 PM  Follow-up for Phone Call        Rx sent into pharmacy. Pt notified. Marrion Coy, CNA  January 24, 2010 1:42 PM  Follow-up by: Marrion Coy, CNA,  January 24, 2010 1:42 PM    Prescriptions: CRESTOR 10 MG TABS (ROSUVASTATIN CALCIUM) Take 1 tablet by mouth once a day  #90 x 1   Entered by:   Marrion Coy, CNA   Authorized by:   Norva Karvonen, MD   Signed by:   Marrion Coy, CNA on 01/24/2010   Method used:   Faxed to ...       [facility] (mail-order)             , [place]         Ph:        Fax: [phone]   RxID:   [id number] METOPROLOL TARTRATE 50 MG TABS (METOPROLOL TARTRATE) 1 by mouth two times a day  #180 x 1   Entered by:   Marrion Coy, CNA   Authorized by:   Norva Karvonen, MD   Signed by:   Marrion Coy, CNA on 01/24/2010   Method used:   Faxed to ...       [facility] (mail-order)             , [place]         Ph:        Fax: [phone]   RxID:   [id number]

## 2010-09-03 ENCOUNTER — Encounter: Payer: Self-pay | Admitting: Family Medicine

## 2010-09-06 ENCOUNTER — Ambulatory Visit (INDEPENDENT_AMBULATORY_CARE_PROVIDER_SITE_OTHER): Payer: Medicare Other | Admitting: Family Medicine

## 2010-09-06 ENCOUNTER — Encounter: Payer: Self-pay | Admitting: Family Medicine

## 2010-09-06 DIAGNOSIS — I10 Essential (primary) hypertension: Secondary | ICD-10-CM

## 2010-09-06 DIAGNOSIS — E119 Type 2 diabetes mellitus without complications: Secondary | ICD-10-CM

## 2010-09-06 DIAGNOSIS — N138 Other obstructive and reflux uropathy: Secondary | ICD-10-CM

## 2010-09-06 DIAGNOSIS — I2510 Atherosclerotic heart disease of native coronary artery without angina pectoris: Secondary | ICD-10-CM

## 2010-09-06 DIAGNOSIS — N401 Enlarged prostate with lower urinary tract symptoms: Secondary | ICD-10-CM

## 2010-09-06 DIAGNOSIS — M109 Gout, unspecified: Secondary | ICD-10-CM

## 2010-09-06 DIAGNOSIS — N139 Obstructive and reflux uropathy, unspecified: Secondary | ICD-10-CM

## 2010-09-06 LAB — BASIC METABOLIC PANEL
BUN: 21 mg/dL (ref 6–23)
Creatinine, Ser: 1.2 mg/dL (ref 0.4–1.5)
GFR: 66.47 mL/min (ref >60.00)
Sodium: 140 mEq/L (ref 135–145)

## 2010-09-06 LAB — CBC WITH DIFFERENTIAL/PLATELET
Eosinophils Relative: 4.3 % (ref 0.0–5.0)
Lymphocytes Relative: 20.9 % (ref 12.0–46.0)
MCV: 88.1 fl (ref 78.0–100.0)
Monocytes Relative: 9.5 % (ref 3.0–12.0)
Neutrophils Relative %: 64.8 % (ref 43.0–77.0)
RDW: 15.2 % — ABNORMAL HIGH (ref 11.5–14.6)
WBC: 6 10*3/uL (ref 4.5–10.5)

## 2010-09-06 LAB — LIPID PANEL
Cholesterol: 118 mg/dL (ref 0–200)
LDL Cholesterol: 64 mg/dL (ref 0–99)

## 2010-09-06 LAB — POCT URINALYSIS DIPSTICK
Bilirubin, UA: NEGATIVE
Blood, UA: NEGATIVE
Glucose, UA: NEGATIVE
Ketones, UA: NEGATIVE
Leukocytes, UA: NEGATIVE
pH, UA: 6

## 2010-09-06 LAB — TSH: TSH: 2.84 u[IU]/mL (ref 0.35–5.50)

## 2010-09-06 LAB — HEPATIC FUNCTION PANEL
Alkaline Phosphatase: 48 U/L (ref 39–117)
Bilirubin, Direct: 0.2 mg/dL (ref 0.0–0.3)
Total Bilirubin: 0.7 mg/dL (ref 0.3–1.2)
Total Protein: 6.8 g/dL (ref 6.0–8.3)

## 2010-09-06 NOTE — Progress Notes (Signed)
  Subjective:    Patient ID: Kevin Mcbride, male    DOB: 09-04-1945, 65 y.o.   MRN: [id number]  HPI 65 yr old male for a cpx. He feels great and has no complaints. He will see Dr. Excell Seltzer in August, and he will be due for a colonoscopy at that time as well.    Review of Systems  Constitutional: Negative.   HENT: Negative.   Eyes: Negative.   Respiratory: Negative.   Cardiovascular: Negative.   Gastrointestinal: Negative.   Genitourinary: Negative.   Musculoskeletal: Negative.   Skin: Negative.   Neurological: Negative.   Hematological: Negative.   Psychiatric/Behavioral: Negative.        Objective:   Physical Exam  Constitutional: He is oriented to person, place, and time. He appears well-developed and well-nourished. No distress.  HENT:  Head: Normocephalic and atraumatic.  Right Ear: External ear normal.  Left Ear: External ear normal.  Nose: Nose normal.  Mouth/Throat: Oropharynx is clear and moist. No oropharyngeal exudate.  Eyes: Conjunctivae and EOM are normal. Pupils are equal, round, and reactive to light. Right eye exhibits no discharge. Left eye exhibits no discharge. No scleral icterus.  Neck: Neck supple. No JVD present. No tracheal deviation present. No thyromegaly present.  Cardiovascular: Normal rate, regular rhythm, normal heart sounds and intact distal pulses.  Exam reveals no gallop and no friction rub.   No murmur heard. Pulmonary/Chest: Effort normal and breath sounds normal. No respiratory distress. He has no wheezes. He has no rales. He exhibits no tenderness.  Abdominal: Soft. Bowel sounds are normal. He exhibits no distension and no mass. There is no tenderness. There is no rebound and no guarding.  Genitourinary: Rectum normal, prostate normal and penis normal. Guaiac negative stool. No penile tenderness.  Musculoskeletal: Normal range of motion. He exhibits no edema and no tenderness.  Lymphadenopathy:    He has no cervical adenopathy.    Neurological: He is alert and oriented to person, place, and time. He has normal reflexes. No cranial nerve deficit. He exhibits normal muscle tone. Coordination normal.  Skin: Skin is warm and dry. No rash noted. He is not diaphoretic. No erythema. No pallor.  Psychiatric: He has a normal mood and affect. His behavior is normal. Judgment and thought content normal.          Assessment & Plan:  Get fasting labs today

## 2010-09-07 ENCOUNTER — Telehealth: Payer: Self-pay

## 2010-09-07 NOTE — Telephone Encounter (Signed)
Pt aware.

## 2010-09-07 NOTE — Telephone Encounter (Signed)
Message copied by Kyung Rudd on Wed Sep 07, 2010  2:54 PM ------      Message from: Dwaine Deter      Created: Wed Sep 07, 2010 10:09 AM       All normal. The A1c is 5.8

## 2010-09-09 NOTE — Progress Notes (Signed)
  Subjective:    Patient ID: Kevin Mcbride, male    DOB: 04/21/1946, 65 y.o.   MRN: [id number]  HPI    Review of Systems     Objective:   Physical Exam        Assessment & Plan:  His HTN and diabetes and gout all seem to be well controlled. We will check labs today to evaluate these issues further. Continue current meds. Continue to exercise and watch the diet.

## 2010-09-14 ENCOUNTER — Telehealth: Payer: Self-pay | Admitting: Cardiovascular Disease

## 2010-09-14 ENCOUNTER — Telehealth: Payer: Self-pay | Admitting: *Deleted

## 2010-09-14 NOTE — Telephone Encounter (Signed)
Left mess on cell phone labs mailed to home address.

## 2010-09-14 NOTE — Telephone Encounter (Signed)
Pt needs written report of labs, please.

## 2010-09-20 NOTE — Progress Notes (Signed)
Summary: refill  Phone Note Refill Request Message from:  Patient on September 14, 2010 4:09 PM  Refills Requested: Medication #1:  CRESTOR 10 MG TABS Take 1 tablet by mouth once a day Send to [facility]  Initial call taken by: Judie Grieve,  September 14, 2010 4:10 PM    Prescriptions: CRESTOR 10 MG TABS (ROSUVASTATIN CALCIUM) Take 1 tablet by mouth once a day  #90 Tablet x 2   Entered by:   Hardin Negus, RMA   Authorized by:   Norva Karvonen, MD   Signed by:   Hardin Negus, RMA on 09/15/2010   Method used:   Faxed to ...       [facility] (mail-order)             , [place]         Ph:        Fax: [phone]   RxID:   [id number]

## 2010-09-26 ENCOUNTER — Encounter: Payer: Medicare Other | Admitting: Family Medicine

## 2010-09-28 ENCOUNTER — Other Ambulatory Visit: Payer: Self-pay | Admitting: Cardiovascular Disease

## 2010-09-29 NOTE — Telephone Encounter (Signed)
[address] °

## 2010-10-01 ENCOUNTER — Other Ambulatory Visit: Payer: Self-pay | Admitting: *Deleted

## 2010-10-01 MED ORDER — ROSUVASTATIN CALCIUM 10 MG PO TABS: 10.0000 mg | ORAL_TABLET | Freq: Every day | ORAL | Status: DC

## 2010-11-15 ENCOUNTER — Other Ambulatory Visit: Payer: Self-pay | Admitting: Cardiovascular Disease

## 2010-11-15 NOTE — Assessment & Plan Note (Signed)
[facility]                            CARDIOLOGY OFFICE NOTE   NAME:Stangelo, Kevin Mcbride                    MRN:          [id number]  DATE:04/03/2007                            DOB:          [birth year]    Kevin Mcbride is a 65 year old gentleman who returns for followup at  the [facility] Cardiology office on April 03, 2007.  He presented with a  non-ST elevation myocardial infarction in January of 2008, and  ultimately underwent multivessel coronary bypass surgery for critical  left main disease.  He has had an excellent recovery, and is currently  asymptomatic.   Kevin Mcbride is working actively and is also exercising regularly.  He  does aerobic activities 5 days a week as well as light weight lifting.  He has no chest pain or dyspnea with that level of activity.  He denies  orthopnea, PND, palpitations, lightheadedness, or syncope.  He really  has no complaints at this time and is tolerating his medical regimen  well.   CURRENT MEDICATIONS:  1. Metoprolol 50 mg twice daily.  2. Aspirin 325 mg daily.  3. Crestor 10 mg daily.   ALLERGIES:  No known drug allergies.   PHYSICAL EXAMINATION:  The patient is alert and oriented, he is in no  acute distress.  Weight is 197, blood pressure 116/78, heart rate 60, respiratory rate  12.  HEENT:  Normal.  NECK:  Normal carotid upstrokes without bruits.  Jugular venous pressure  is normal.  LUNGS:  Clear to auscultation bilaterally.  HEART:  Regular rate and rhythm without murmurs or gallops.  ABDOMEN:  Soft, obese, nontender, no organomegaly.  EXTREMITIES:  No cyanosis, clubbing, or edema.  Peripheral pulses are 2+  and equal throughout.   EKG shows normal sinus rhythm and is within normal limits.  Ventricular  rate is 60 beats per minute.  Lipids from September 23 show a total  cholesterol of 108 with triglycerides 52, HDL 32, LDL 66.  Liver  function tests were all within normal limits.   ASSESSMENT:  1. Coronary artery disease, status post coronary artery bypass      grafting.  Mr. Bernasconi is doing remarkably well.  I encouraged him      to continue his exercise program and try to push himself with      aerobic activity as he has a low HDL and may benefit from      increasing exercise.  I did not make any changes to his medical      regimen today.  2. Dyslipidemia.  He had gynecomastia with atorvastatin.  He was      changed to low-dose Crestor and is somewhat improved, but still has      a degree of gynecomastia that he never appreciated in the past.      His LDL is at goal on Crestor 10 mg, and I did not make any changes      based on his most recent labs.  He would benefit from increased      exercise as above to increase his HDL.  I did not initiate  niacin      at today's visit.   For followup, I would like to see Mr. Martis back in 6 months or  sooner if any new problems arise.  He is going to schedule an  appointment with Dr. Clent Ridges in the near future for his yearly physical.     Veverly Fells. Excell Seltzer, MD  Electronically Signed    MDC/MedQ  DD: 04/03/2007  DT: 04/03/2007  Job #: [id number]   cc:   Tera Mater. Clent Ridges, MD

## 2010-11-15 NOTE — Assessment & Plan Note (Signed)
[facility]                            CARDIOLOGY OFFICE NOTE   NAME:Kevin Mcbride, Kevin Mcbride                    MRN:          [id number]  DATE:11/06/2007                            DOB:          11-Aug-1945    Kevin Mcbride was seen in followup with [facility] Cardiology office on  Nov 06, 2007.  Mr. Kevin Mcbride is a 65 year old gentleman who had a non-ST-  elevation myocardial infarction in January 2008 and underwent  multivessel CABG for critical left main stenosis.  He has done really  well since his surgery and continues to be asymptomatic.   Mr. Kevin Mcbride has an excellent exercise program.  He exercises daily with  aerobic activity as well as weight lifting.  He denies any symptoms with  activity.  He specifically denies chest pain, dyspnea, palpitations,  lightheadedness or syncope.  He is having no problems at present.   MEDICATIONS:  1. Metoprolol 50 mg twice daily.  2. Aspirin 325 mg daily.  3. Crestor 10 mg daily in the daily.  4. Daily multivitamin.   ALLERGIES:  NKDA.   EXAM:  He is alert and oriented.  No acute distress.  A fit-appearing  male.  Weight is 198, blood pressure 120/64, heart rate 60, respiratory rate  12.  HEENT:  Normal.  NECK:  Normal carotid upstrokes without bruits.  Jugular venous pressure  is normal.  LUNGS:  Clear to auscultation bilaterally.  HEART:  Regular rate and rhythm without murmurs or gallops.  ABDOMEN:  Soft, nontender no organomegaly.  No abdominal bruits.  EXTREMITIES:  No clubbing, cyanosis or edema.  Peripheral pulses are 2+  and equal throughout.   EKG shows sinus rhythm with right bundle branch block and left axis  deviation.   ASSESSMENT:  1. Coronary artery disease status post CABG.  New right bundle branch      block noted but no symptomatic concerns for ischemia.  I encouraged      Kevin Mcbride regarding his excellent exercise routine.  I think he      should continue his current medications  without changes.  2. Dyslipidemia.  He is on Crestor 10 mg.  Lipids from September 2008      showed a cholesterol 108, triglycerides 52, HDL 32 and LDL 66.  He      will follow up with lipids in September.   For follow-up, I would like to see Kevin Mcbride back in 1 year.  We will  be in touch with him by telephone after his lipids are drawn in  September.  If he has any problems I would be happy to see him in the  interim.     Veverly Fells. Excell Seltzer, MD  Electronically Signed    MDC/MedQ  DD: 11/25/2007  DT: 11/25/2007  Job #: [id number]   cc:   Jeannett Senior A. Clent Ridges, MD

## 2010-11-15 NOTE — Procedures (Signed)
[place] [facility]                              EXERCISE TREADMILL   NAME:NICHOLESWinton, Offord                    MRN:          [id number]  DATE:11/14/2006                            DOB:          1945-10-02    INDICATIONS FOR PROCEDURE:  Mr. Kyser is a 65 year old gentleman with  coronary artery disease status post recent bypass surgery. He is  beginning an exercise program and an exercise treadmill stress study was  performed prior to increasing his exercise.   Baseline EKG demonstrated sinus bradycardia with a heart rate of 59  beats per minute and was otherwise within normal limits. Mr. Gottlieb  exercised 9 minutes according to the Bruce protocol and achieved a  maximum work level of 10 metabolic equivalents. His heart rate rose from  64 beats per minute to 122 beats per minute representing 76% of his  maximal age predicted heart rate. The study was performed with no  interruption in the patient's beta blockade. His resting blood pressure  increased from 138/82 to a maximal blood pressure of 169/86. There was  no chest pain, arrhythmia or significant ST segment change with  exercise.   CONCLUSION:  Negative exercise stress study.     Veverly Fells. Excell Seltzer, MD     MDC/MedQ  DD: 11/14/2006  DT: 11/14/2006  Job #: [id number]

## 2010-11-18 NOTE — Assessment & Plan Note (Signed)
[place] [facility]                            CARDIOLOGY OFFICE NOTE   NAME:Kevin Mcbride, Kevin Mcbride                    MRN:          [id number]  DATE:10/09/2006                            DOB:          05-Jan-1946    HISTORY OF PRESENT ILLNESS:  Kevin Mcbride was seen in outpatient  cardiology followup at the [facility] on October 09, 2006. He is a 65-  year-old gentleman who underwent emergent coronary bypass surgery for  critical left main disease after he presented with a non-ST segment  elevation MI in January of 2008. He has done very well since his bypass  surgery. From a symptomatic standpoint, he has no chest pain, dyspnea,  orthopnea, PND, edema, light headedness, or syncope. He has been walking  on the treadmill for 30 minutes on a regular basis. He walks  approximately 1 and 3/4 miles over this time period. His only complaint  is that of gynecomastia. His breast area has been tender but the  tenderness has resolved. He does note gynecomastia, which is new for him  since his hospitalization in January. He has no other complaints.   CURRENT MEDICATIONS:  1. Lipitor 80 mg daily.  2. Metoprolol 50 mg b.i.d.  3. Aspirin 325 mg daily.   ALLERGIES:  NO KNOWN DRUG ALLERGIES.   PHYSICAL EXAMINATION:  VITAL SIGNS:  Weight 196 pounds. Blood pressure  112/70. Heart rate 76. Respiratory rate 12.  HEENT:  Normal.  NECK:  Normal carotid upstrokes without bruits. Jugular venous pressure  is normal.  LUNGS:  Clear to auscultation bilaterally.  HEART:  Regular rate and rhythm without murmurs or gallops.  CHEST:  There is mild bilateral gynecomastia.  ABDOMEN:  Soft, nontender. No organomegaly.  EXTREMITIES:  No clubbing, cyanosis, or edema. Peripheral pulses are 2+  and equal throughout.   ASSESSMENT:  Kevin Mcbride is currently stable from a cardiac standpoint.  His cardiac issues are as follows:  1. Coronary artery disease with critical left main disease, now  status      post coronary bypass surgery by Dr. Dorris Fetch back in January of      2008. [name] has preserved left ventricular function with an      estimated ejection fraction of 60%. Will continue him on a daily      aspirin and metoprolol. He wishes to increase his exercise routine      and I would like to perform an exercise treadmill study, which will      be scheduled for the near future. Following his exercise treadmill,      we can allow him to perform more vigorous exercise.  2. Dyslipidemia. His most recent lipids on high dose Lipitor showed a      total cholesterol of 96 with triglyceride level of 57, HDL 32, and      LDL of 53. I did not know of the gynecomastia as a  potential side      effect of either of his medications. I reviewed this with our Adelfa Koh, and she knows of a case  report of Lipitor causing      gynecomastia. I have elected to discontinue his Lipitor and try him      on low-dose Crestor 10 mg daily, since it works via a different      pathway. Hopefully, this will resolve with changing his medication.   FOLLOWUP:  I would like to see [name] back in 6 months with repeat  lipids and LFT's at that point. Will evaluate him at the time of his  exercise treadmill study as well.     Veverly Fells. Excell Seltzer, MD  Electronically Signed    MDC/MedQ  DD: 10/09/2006  DT: 10/09/2006  Job #: [id number]   cc:   Jeannett Senior A. Clent Ridges, MD  Salvatore Decent Dorris Fetch, M.D.

## 2010-11-18 NOTE — H&P (Signed)
NAME:  Kevin Mcbride, Kevin Mcbride NO.:  [id number]   MEDICAL RECORD NO.:  [id number]          PATIENT TYPE:  EMS   LOCATION:  ED                           FACILITY:  [facility]   PHYSICIAN:  Kevin Bandy, MD      DATE OF BIRTH:  1945/12/12   DATE OF ADMISSION:  07/14/2006  DATE OF DISCHARGE:                              HISTORY & PHYSICAL   PRIMARY CARE PHYSICIAN:  [facility].   CARDIOLOGIST:  None.   CHIEF COMPLAINT:  Chest pain.   HISTORY OF PRESENT ILLNESS:  Kevin Mcbride is a very pleasant 65 year old  Caucasian male with a history of hypertension and hyperlipidemia who  started having episodes of substernal exertional chest pain at  approximately 11 a.m. this morning when carrying things into his attic.  He had several more of these episodes through the early afternoon, all  of them lasting only a few minutes and all of the relieved by rest.  Then at approximately 7 p.m., he had an episode at rest that lasted over  15 minutes that was associated with nausea, shortness of breath, and  diaphoresis.  His wife convinced him to present to the emergency  department and he was pain free upon arrival.  He reports that, looking  back now, he thinks he has been having occasional episodes of exertional  chest pain over the past several months, but never this severe and never  at rest.  Of note, he reports that he had some type of stress test in  the Fall of 2007, but these results are not immediately available,  although the patient was told they were negative.   PAST MEDICAL HISTORY:  1. Hypertension.  2. Hyperlipidemia.  3. Gout.   MEDICATIONS:  1. Lipitor 20 mg p.o. daily.  2. Maxzide 75/50, one tablet p.o. daily.   NO KNOWN DRUG ALLERGIES.   SOCIAL HISTORY:  The patient denies any tobacco use.  He drinks  approximately 2 drinks of alcohol per week.  He denies any illicit drug  use.   FAMILY HISTORY:  His mother died at 62 from congestive  heart failure,  unclear whether she had a history of myocardial infarction.  Father died  at age 42 from lung cancer but reportedly had myocardial infarction at  age 110.   REVIEW OF SYSTEMS:  Positive for chest pain, shortness of breath, nausea  and vomiting, and diaphoresis as outlined in the HPI.  Otherwise ten  systems reviewed and negative, other than as noted above in the HPI.   PHYSICAL EXAMINATION:  VITAL SIGNS:  Blood pressure 122/70, pulse 104,  oxygen saturation 96% on 2 liters nasal cannula.  GENERAL:  The patient breathing comfortably in no apparent distress,  chest pain free.  HEENT:  Normocephalic atraumatic.  Sclerae anicteric.  Extraocular  movements intact.  NECK:  Supple.  No masses appreciated.  No carotid bruits or jugular  venous distention.  CARDIOVASCULAR:  Regular rate and rhythm.  Tachycardic.  S1 and S2 are  normal.  No murmurs, rubs, or gallops.  CHEST:  Clear to auscultation bilaterally.  ABDOMEN:  Soft, nontender, nondistended.  EXTREMITIES:  Warm, no edema.  Two plus dorsalis pedis pulses  bilaterally.  SKIN:  No rashes.  NEUROLOGIC:  Alert and oriented  x3.  Cranial nerves grossly intact.  Moving all extremities well.  MUSCULOSKELETAL:  No joint deformities or effusions.  PSYCHIATRIC:  Affect appropriate and platelets.  Alert and oriented x3.   EKG:  Normal sinus rhythm at 98 beats per minute with marked  inferolateral ST depression concerning for ischemia.   LABORATORY VALUES:  White blood cell count 6.6, hemoglobin 14.1,  hematocrit 41, platelets 245.  Sodium 142, potassium 3.4, chloride 110,  bicarb 23, BUN 30, creatinine 1.5, glucose 141.  Troponin less than  0.05, CK-MB 2.9.   ASSESSMENT:  A 65 year old white male with hypertension and  hyperlipidemia presenting with symptoms very concerning for unstable  angina and acute coronary syndrome with EKG showing marked inferolateral  ST depression.  He is now chest pain free in the emergency  department.  Although his initial set of cardiac enzymes are negative, I suspect that  he will rule in for a non-ST elevation myocardial infarction.   PLAN:  1. We will admit him and transfer him back to [facility] for      further monitoring and care.  2. Continue aspirin, intravenous heparin per protocol. We may add a      glycoprotein 2B/3A inhibitor if his enzymes in fact show he has had      an acute myocardial infarction or if he has recurrent chest pain.  3. We will initiate aggressive beta-blocker therapy and continue IV      nitroglycerin which he is on presently in the emergency department.  4. Hyperlipidemia.  We will continue his home dose of Lipitor and      check a fasting lipid profile.  5. Hypokalemia.  We will replete his potassium and try to keep his      potassium greater than 4 in the setting of possible acute MI.  6. At present, there is no emergent need for cardiac catheterization,      but we will monitor him closely and should he have chest pain that      we have difficulty managing medically, we might have to consider      taking him to the cardiac cath lab sooner.      Kevin Bandy, MD  Electronically Signed     JJC/MEDQ  D:  07/14/2006  T:  07/14/2006  Job:  [id number]

## 2010-11-18 NOTE — Op Note (Signed)
NAME:  Kevin Mcbride, Kevin Mcbride NO.:  [id number]   MEDICAL RECORD NO.:  [id number]          PATIENT TYPE:  INP   LOCATION:  2313                         FACILITY:  [facility]   PHYSICIAN:  Salvatore Decent. Dorris Fetch, M.D.DATE OF BIRTH:  05-19-1946   DATE OF PROCEDURE:  07/16/2006  DATE OF DISCHARGE:                               OPERATIVE REPORT   PREOPERATIVE DIAGNOSIS:  Critical left main disease, status post non-Q-  wave myocardial infarction.   POSTOPERATIVE DIAGNOSIS:  Critical left main disease, status post non-Q-  wave myocardial infarction.   PROCEDURE:  Emergency median sternotomy, extracorporeal circulation,  coronary bypass grafting x3 (left internal mammary artery to left  anterior descending, saphenous vein graft to first diagonal, saphenous  vein graft to obtuse marginal one), endoscopic vein harvest right thigh.   SURGEON:  Salvatore Decent. Dorris Fetch, M.D.   ASSISTANT:  Kerin Perna, M.D.   SECOND ASSISTANT:  Jerold Coombe, P.A.   ANESTHESIA:  General.   FINDINGS:  No graftable targets in right coronary distribution,  diffusely diseased coronaries, target vessels good quality at the site  anastomosis, good-quality conduits.   CLINICAL NOTE:  Kevin Mcbride is a 65 year old gentleman with no prior  cardiac history, who presented with unstable angina and ruled in for non-  Q-wave myocardial infarction.  He underwent cardiac catheterization this  morning which revealed severe left main disease in the setting of a  chronically occluded right coronary.  He was advised to undergo emergent  coronary artery bypass grafting.  The indications, benefits and  alternatives were discussed in detail with the patient, he understood  and accepted risks and agreed to proceed.  The patient was transported  to the operating room as soon as it was available.   OPERATIVE NOTE:  Kevin Mcbride was brought from the CCU to the preop  holding area on 07/16/2006.  There the  anesthesia service placed lines  for monitoring arterial, central venous and pulmonary arterial pressure.  Intravenous antibiotics were administered.  He was taken to the  operating room, anesthetized and intubated.  He tolerated intubation  well without any hemodynamic changes or evidence of ischemia.  A Foley  catheter was placed.  The chest, abdomen, and legs were prepped and  draped in usual fashion.   Incision made in the medial aspect of the right leg and greater  saphenous vein was harvested from the right thigh endoscopically.  It  was a good-quality vessel.  Simultaneously, a median sternotomy was  performed and the left internal mammary artery was harvested using  standard technique.  The patient was heparinized during the vessel  harvest.  The mammary artery was of good quality vessel with excellent  flow.   The pericardium was opened.  The ascending aorta was inspected.  There  was no evidence of atherosclerotic disease.  The aorta was cannulated  via concentric 2-0 Ethibond pledgeted pursestring sutures.  A dual stage  venous cannula placed via pursestring suture in the right atrial  appendage.  Cardiopulmonary bypass was instituted and the patient was  cooled to 32 degrees Celsius.  The coronary arteries were inspected  and  anastomotic sites were chosen.  There were no graftable vessels in the  right coronary distribution.  This is a codominant system and there was  a very small posterior descending from the right coronary system and  also very small posterior descending from the circumflex.  The conduits  were inspected and cut to length.  A foam pad was placed in the  pericardium to protect the left phrenic nerve.  A temperature probe was  placed in the myocardial septum.  A cardioplegia cannula was placed in  the ascending aorta.   The aorta was crossclamped, left ventricle was emptied via aortic root  vent.  Cardiac arrest was achieved with a combination of cold  antegrade  blood cardioplegia and topical iced saline.  After achieving complete  diastolic arrest and adequate myocardial septal cooling to 10 degrees  Celsius, the following distal anastomoses were performed.   First a reverse saphenous vein grafts placed end-to-side to obtuse  marginal one this was a dominant lateral vessel.  It was a 1.5-mm good-  quality target at the site of anastomosis although there was plaquing in  the vessel diffusely.  This was present in all the target vessels.  The  vein graft was anastomosed end-to-side with a running 7-0 Prolene  suture.  All anastomoses were probed proximally and distally at their  completion and cardioplegia was administered down the vein graft with  good flow and good hemostasis.   Next a reverse saphenous vein grafts was placed end-to-side to the first  diagonal branch.  This could not be well seen on the catheterization  films but was a 1.5 mm vessel.  There was significant plaquing  proximally.  The vein graft was anastomosed end-to-side with running 7-0  Prolene suture.   Next the left internal mammary artery was brought through a window in  the pericardium.  The distal end was beveled and was anastomosed end-to-  side to the distal LAD.  The LAD was diffusely diseased but was good  quality at the site of the anastomosis.  The anastomosis was performed  with a running 8-0 Prolene suture.  At the completion of the mammary to  LAD anastomosis, the bulldog clamp was briefly removed to inspect for  hemostasis.  Immediate and rapid septal rewarming was noted.  The  bulldog clamp was placed.  The mammary pedicle was tacked to the  epicardial surface of the heart with 6-0 Prolene sutures.   Additional cardioplegia was administered down the vein grafts and in the  aortic root.  The vein grafts then cut to length.  The cardioplegia  cannulas removed from the ascending aorta and the two proximal vein graft anastomoses were performed to  4.5 mm punch aortotomies with  running 6-0 Prolene sutures while under crossclamp.  At the completion  of all proximal anastomosis, the patient was placed in Trendelenburg  position.  The bulldog clamp was again removed from the left mammary  artery.  Immediate and rapid septal rewarming was again noted.  Lidocaine was administered.  The aortic root was de-aired and aortic  crossclamp was removed.  The total crossclamp time was 48 minutes.   While the patient was being rewarmed, all proximal and distal  anastomoses were inspect for hemostasis.  The patient spontaneously  resumed rhythm and did not require defibrillation.  When the patient  rewarmed to a core temperature of 37 degrees Celsius, he was weaned from  cardiopulmonary bypass without inotropic support and in sinus rhythm.  He weaned without difficulty on the first attempt.  Total bypass time  was 71 minutes.  The initial cardiac index was 9 liters per minute.  A  test dose protamine was administered and was well tolerated.  The atrial  aortic cannulae were removed.  The remaining protamine was administered  without incident.  Chest irrigated with 1 liter of warm normal saline  containing 1 gram of vancomycin.  Hemostasis was achieved.  The left  pleural and mediastinal chest tubes placed through separate subcostal  incisions.  The pericardium was reapproximated with interrupted 3-0 silk  sutures and came together easily without tension.  The sternum was  closed with heavy gauge interrupted stainless steel  wires.  The remaining incisions closed in standard fashion.  All sponge,  needle and instrument counts were correct at the end of the procedure.  The patient was taken from the operating room to the surgical intensive  care unit in critical but stable condition.           ______________________________  Salvatore Decent Dorris Fetch, M.D.     SCH/MEDQ  D:  07/16/2006  T:  07/17/2006  Job:  [id number]   cc:   Veverly Fells. Excell Seltzer,  MD  Tera Mater. Clent Ridges, MD

## 2010-11-18 NOTE — Letter (Signed)
October 09, 2006    Dr[facility]  [address]  [place], [address].   RE:  [name], [name]  MRN:  [id number]  /  DOB:  Feb 08, 1946   Dear Dr. Jeanell Sparrow:   This letter is regarding Kevin Mcbride who is a mutual patient. Mr.  Kimberlin has requested a letter regarding his cardiac condition so that  he can have dental work performed. [name] underwent coronary  bypass surgery in January of this year after he presented with a  myocardial infarction. He has done very well since bypass surgery and  has no contraindication to dental work. He does not require antibiotic  prophylaxis as he has no valvular disease.   If you have any questions, please feel free to call my office at [phone]. Thank you.    Sincerely,      Veverly Fells. Excell Seltzer, MD  Electronically Signed    MDC/MedQ  DD: 10/24/2006  DT: 10/25/2006  Job #: [id number]

## 2010-11-18 NOTE — Discharge Summary (Signed)
NAME:  Kevin Mcbride, Kevin Mcbride NO.:  [id number]   MEDICAL RECORD NO.:  [id number]          PATIENT TYPE:  INP   LOCATION:  2035                         FACILITY:  [facility]   PHYSICIAN:  Salvatore Decent. Dorris Fetch, M.D.DATE OF BIRTH:  [birth year]   DATE OF ADMISSION:  07/14/2006  DATE OF DISCHARGE:                               DISCHARGE SUMMARY   CARDIOLOGIST:  Veverly Fells. Excell Seltzer, M.D.   PRIMARY CARE PHYSICIAN:  Jeannett Senior A. Clent Ridges, MD   DISCHARGE DATE:  Tentatively one to two days.   ADMISSION DIAGNOSIS:  Chest pain.   DISCHARGE DIAGNOSES:  1. Critical left main disease status post non-Q wave myocardial      infarction status post emergent coronary artery bypass grafting.  2. Hypertension.  3. Hyperlipidemia.  4. Gout.  5. Chronic vein disease in his left leg.  6. Postoperative acute blood loss anemia.  7. Postoperative volume overload.   CONSULTATIONS:  On July 16, 2006, Dr. Charlett Lango was  consulted of Cardiothoracic Surgery.   PROCEDURES:  1. On July 16, 2006, the patient underwent cardiac catheterization      by Dr. Tonny Bollman.  2. On July 16, 2006, the patient underwent emergency median      sternotomy, extracorporeal circulation, coronary artery bypass      grafting x3 (left anterior mammary artery to left interior      descending, saphenous vein graft to the first diagonal, saphenous      vein graft to the obtuse marginal 1), and endoscopic vein harvest,      right thigh, by Dr. Charlett Lango.   HISTORY AND PHYSICAL:  This is a 65 year old gentleman with no prior  cardiac history who presented to [facility] on July 14, 2006, with  unstable angina and was found to have a non-Q wave MI.  He has a history  of hypertension, hyperlipidemia, and chronic vein disease in the left  leg.  The patient complained of multiple episodes of exertional chest  pain.  The patient complained of nausea, shortness of breath, and  diaphoresis.  Please see  dictated history and physical for further  details.   HOSPITAL COURSE:  On July 14, 2006, the patient came to the [facility] Emergency Room.  Initial cardiac enzymes were positive for a non-Q  wave MI with creatinine kinase 259, MB 18.7, troponin 2.33.  The patient  was admitted, started IV heparin and nitroglycerin and was stable after  that time.  The patient did not complain of any further chest pain or  shortness of breath and was set up for a cardiac catheterization on  July 16, 2006.  The patient's hemoglobin and hematocrit were stable  on admission.  BUN and creatinine were stable on admission.   On July 16, 2006, the patient underwent cardiac catheterization by  Dr. Excell Seltzer which showed critical left main disease, chronic RCA  occlusion, and normal LV function.  The patient was then sent for  emergency CABG.  On July 16, 2006, the patient underwent emergent  CABG x3 by Dr. Charlett Lango without any complications.   The patient was extubated postop  day #1.  The patient's blood pressure  was stable and he was in normal sinus rhythm.  He did have a temperature  of 100.  By postop day #2, the patient's temperature improved.  Blood  pressure was stable at 117/70, heart rate 93 and sinus rhythm.  The  patient had some acute blood loss anemia; however, he did not require  any blood transfusions since his hemoglobin and hematocrit are rising  appropriately and stable.  The patient did have volume overload  postoperatively and he is being diuresed with Lasix and responding  appropriately.  The patient's renal function is within normal limits  with a BUN of 20 and creatinine 1.4.  The patient's white blood cell  count was 8.1.  CBGs have been well-controlled.  The patient does have a  history of diabetes mellitus.  The patient was extubated without any  problems.  His oxygen was weaned and he is currently 95% on room air.  He is ambulating with cardiac rehab with a  steady gait.  The patient is  continuing his incentive spirometry as instructed.   The patient was transferred to 2000 on July 17, 2006. He did have  some hypokalemia and his potassium was replaced.  His potassium did rise  appropriately, and postop day #2 went from 3.7 to 4.0.   DISCHARGE DISPOSITION:  The patient will be discharged home in the next  1 to 2 days provided he remains in normal sinus rhythm, blood pressure  is well controlled, the patient is afebrile, and he continues to  progress in a positive manner.   MEDICATIONS:  1. Aspirin 325 mg p.o. daily.  2. Lopressor 25 mg p.o. b.i.d.  3. Lipitor 80 mg p.o. daily.  4. Lasix 40 mg p.o. daily x5 days.  5. Kay Ciel 20 mEq x5 days.  6. Oxycodone 5 mg 1 to 2 tabs p.o. every 4 hours p.r.n.   DISCHARGE INSTRUCTIONS:  The patient was instructed to follow a low-fat,  low-salt diet.  No driving, heavy lifting greater than 10 pounds for 3  weeks.  The patient is to ambulate several times daily and increase  activity as tolerated.  He is to continue his breathing exercises.  He  may shower and clean his incision with mild soap and water.  He is to  call the office if any wound problems arise such as incision erythema,  drainage, temperature greater than 101.5.   FOLLOWUP:  The patient will have a followup appointment with Dr.  Dorris Fetch August 28, 2006, at 11 a.m.  He is to call Dr. Excell Seltzer for  an appointment in 2 weeks, where he will have a chest x-ray taken.      Constance Holster, PA    ______________________________  Salvatore Decent Dorris Fetch, M.D.    JMW/MEDQ  D:  07/18/2006  T:  07/19/2006  Job:  [id number]   cc:   Salvatore Decent. Dorris Fetch, M.D.  Veverly Fells. Excell Seltzer, MD  Tera Mater. Clent Ridges, MD

## 2010-11-18 NOTE — Assessment & Plan Note (Signed)
[facility]                            CARDIOLOGY OFFICE NOTE   NAME:Kevin Mcbride, Kevin Mcbride                    MRN:          [id number]  DATE:08/08/2006                            DOB:          12/15/1945    Arben Packman was seen in hospital followup today at the [facility] as an outpatient.  Mr. Allman is a 65 year old  gentleman with a history of hypertension and dyslipidemia who presented  in mid January with a subendocardial MI.  He was taken for a cardiac  catheterization and found to have critical left main stenosis.  Mr.  Rahming was taken urgently for coronary bypass surgery where he  underwent 3-vessel bypass involving the LIMA to the LAD, saphenous vein  graft to the first diagonal, and saphenous vein graft to the first  obtuse marginal branch by Dr. Dorris Fetch.  He had an excellent recovery  after bypass and has continued to do well as an outpatient.   Currently, Mr. Lawrance complains of some intermittent fatigue.  He  states that he is having some good days and bad days, but overall, he is  doing very well.  He specifically denies chest pain, dyspnea, orthopnea,  PND, edema, or claudication symptoms.   CURRENT MEDICATIONS:  1. Lipitor 80 mg daily.  2. Metoprolol 50 mg twice daily.  3. Aspirin 325 mg daily.   ALLERGIES:  NKDA.   PAST HISTORY:  1. Pertinent for coronary artery disease as described.  He has      preserved left ventricular function.  By catheterization his left      ventricular ejection fraction was estimated at 60%.  2. Essential hypertension.  3. Hyperlipidemia.  4. Gout.   PHYSICAL EXAMINATION:  The patient is alert and oriented.  He is in no  acute distress.  Weight 194 pounds, blood pressure 126/85, heart rate 80, respiratory  rate is 12.  HEENT:  Normal.  NECK:  Normal carotid upstrokes without bruits.  Jugular venous pressure  is normal.  LUNGS:  Clear to auscultation bilaterally.  HEART:  Regular rate and rhythm without murmurs or gallops.  CHEST:  There is a well-healing midline sternotomy scar.  ABDOMEN:  Soft and nontender.  No organomegaly.  EXTREMITIES:  No cyanosis, clubbing, or edema.  Peripheral pulses are 2+  and equal throughout.   EKG:  Demonstrates normal sinus rhythm and is within normal limits.   ASSESSMENT:  Mr. Wasco is currently stable from a cardiovascular  standpoint.  His cardiac problems are as follows.  1. Coronary artery disease status post non-ST myocardial infarction      with critical left main disease treated with urgent coronary bypass      surgery.  2. Hypertension.  3. Dyslipidemia.   PLAN:  Mr. Murri has had an excellent recovery from surgery.  He is  doing very well from a symptomatic standpoint.  I did not make any  changes to his medical regimen today.  His Lipitor dose was increased  during his hospitalization.  He would have followup lipids in 6 weeks.  He was referred for outpatient cardiac rehab  today.  A chest x-ray will  be performed and he will follow up with Dr. Dorris Fetch in approximately  2 weeks as scheduled.  He inquired about returning to work and we will  tentatively plan for him to return to work in early March pending Dr.  Sunday Corn clearance.   For followup, I would like to see him back in 8 weeks.  As always, he  can call any time if any problems arise.     Veverly Fells. Excell Seltzer, MD  Electronically Signed    MDC/MedQ  DD: 08/08/2006  DT: 08/08/2006  Job #: [id number]   cc:   Salvatore Decent. Dorris Fetch, M.D.  Tera Mater. Clent Ridges, MD

## 2010-11-18 NOTE — Consult Note (Signed)
NAME:  Kevin Mcbride, Kevin Mcbride NO.:  [id number]   MEDICAL RECORD NO.:  [id number]          PATIENT TYPE:  INP   LOCATION:  2399                         FACILITY:  [facility]   PHYSICIAN:  Salvatore Decent. Dorris Fetch, M.D.DATE OF BIRTH:  01-Jan-1946   DATE OF CONSULTATION:  07/16/2006  DATE OF DISCHARGE:                                 CONSULTATION   REASON FOR CONSULTATION:  Critical left main disease.   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  He had been having multiple episodes of exertional chest pain. At  approximately 7 p.m. he had an episode with minimal exertion that lasted  approximately 15 minutes. He had nausea, shortness of breath, and  diaphoresis in association with this. He was at his bridge group which  includes a physician who advised him to go to the emergency room. He  came to the emergency room. His initial cardiac enzymes were positive  for a non-Q-wave MI with a creatinine kinase 259, MB 18.7, troponin  2.33. He was admitted, started on heparin and nitroglycerin, and had  stabilized after that time. This morning he underwent cardiac  catheterization. Was found to have critical left main and critical three-  vessel coronary disease. He currently is pain free.   PAST MEDICAL HISTORY:  Significant for hypertension, hyperlipidemia,  gout, and chronic vein disease in his left leg. He had a stress test in  the fall 2007, but does not know results.   MEDICATIONS ON ADMISSION:  1. Lipitor 20 mg daily.  2. Maxzide 75/50 one tablet daily.   ALLERGIES:  He has no known drug allergies.   FAMILY HISTORY:  Mother died at 4 of congestive heart failure. Father  had an MI at 22 and died of lung cancer at 45.   SOCIAL HISTORY:  He works as a Medical illustrator. He is married. He occasionally  uses  alcohol. He does not smoke tobacco.   REVIEW OF SYSTEMS:  Chest pain, shortness of breath, fatigue, nausea,  vomiting, occasional swelling in his left leg. All other systems are  negative.   PHYSICAL EXAMINATION:  GENERAL:  Kevin Mcbride is a 65 year old white  male in no acute distress.  VITAL SIGNS:  Blood pressure 152/94, pulse 83 and regular, respirations  13, oxygen saturation 99% on room air.  GENERAL:  He is well-developed and well-nourished in no acute distress.  NEUROLOGICAL:  He is alert and oriented x3. He is appropriate and  grossly intact.  HEENT:  Unremarkable.  NECK:  Supple without thyromegaly, adenopathy or bruits.  CARDIAC:  Has regular rate and rhythm. Normal S1, S2. No rubs, murmurs  or gallops.  LUNGS:  Clear with equal breath sounds bilaterally.  ABDOMEN:  Soft, nontender.  EXTREMITIES:  Without clubbing, cyanosis or edema. I do not see any  obvious varicosities. He has 2+ pulses.  SKIN:  Remarkable for vitiligo; otherwise, warm and dry.   LABORATORY DATA:  Cardiac enzymes as mentioned. Sodium  138, potassium  3.7, BUN and creatinine 20 and 1.9, glucose 92. White count 6.4,  hematocrit 35, platelets 181. TSH 2.3. Bilirubin 0.7, alk phos 50. AST  and ALT 31 and 24. Albumin 3.6.   EKG shows normal sinus rhythm with inferior infarct age undetermined.  That was at 7:29 this morning.   IMPRESSION:  Kevin Mcbride is a 65 year old gentleman who presents with  unstable angina and non-Q-wave MI.  He has critical left main and total  occlusion of his right coronary. It is unclear if his right coronary has  any graftable targets, but he does have graftable OM and LAD vessels. I  have discussed with Kevin Mcbride and his wife the indications, risks,  benefits, and alternatives. They understand that critical left main  disease is an indication for emergent coronary bypass grafting. They  understand that the risks include but are not limited to death, stroke,  myocardial  infarction, deep venous thrombosis, pulmonary embolism,  bleeding, possible need for transfusions, infections as well as other  organ system dysfunction including respiratory, renal or  gastrointestinal complications. He understands and accepts these risks  and agrees to proceed. The OR has been notified. All rooms are currently  occupied, but he will go into the first available heart room as soon as  it opens up. He currently is pain free and stable on intravenous  nitroglycerin.           ______________________________  Salvatore Decent. Dorris Fetch, M.D.     SCH/MEDQ  D:  07/16/2006  T:  07/16/2006  Job:  [id number]   cc:   Excell Seltzer, M.D.  Tera Mater. Clent Ridges, MD

## 2010-11-18 NOTE — Cardiovascular Report (Signed)
NAME:  Kevin Mcbride, Kevin Mcbride NO.:  [id number]   MEDICAL RECORD NO.:  [id number]          PATIENT TYPE:  INP   LOCATION:  2035                         FACILITY:  [facility]   PHYSICIAN:  Veverly Fells. Excell Seltzer, MD  DATE OF BIRTH:  01/23/1946   DATE OF PROCEDURE:  07/16/2006  DATE OF DISCHARGE:                            CARDIAC CATHETERIZATION   PROCEDURE:  Left heart catheterization, selective coronary angiography,  left ventricular angiography.   INDICATIONS:  Kevin Mcbride is a very nice 65 year old gentleman who  presented with a non-ST-elevation myocardial infarction.  He had been  pain-free on medical therapy since hospitalization and was referred for  cardiac catheterization in the setting of his subendocardial MI.  He has  no past history of coronary artery disease.   PROCEDURAL DETAILS:  The risks and indications of the procedure were  explained in detail to the patient.  Informed consent was obtained.  The  right groin was prepped, draped and anesthetized with 1% lidocaine.  Using a front wall puncture, a 6-French sheath was placed in the right  femoral artery.  Views of the left and right coronary arteries were  taken with a JL-4 and JR-4 catheter, respectively.  An angled pigtail  catheter was then inserted into the left ventricle and pressures were  recorded.  A left ventriculogram was done.  A pullback across the aortic  valve was performed.  At the conclusion of the case the arterial sheath  was left in place and an emergent cardiac surgery consultation was  placed.   FINDINGS:  Aortic pressure 106/61 with a mean of 81, left ventricular  pressure 104//9 with an end-diastolic pressure of 16.   CORONARY ANGIOGRAPHY:  The left mainstem has critical stenosis in its  midportion of at least 95%.  It bifurcates into the LAD and left  circumflex.   The LAD is poorly visualized due to the severe left main stenosis, but  there appears to be at least moderate disease in  the proximal portion of  the LAD.  The LAD gives off a first diagonal branch.  There appears to  be a reasonably good this mid and distal LAD target.   The left circumflex is large-caliber and is dominant.  It gives off a  first OM branch and then a left PDA in a left posterolateral branch.  There is a 90% stenosis at the origin of the first OM branch.   The right coronary artery is chronically occluded in its proximal  portion.  It appears to be nondominant, and there are bridging  collaterals that feed the mid and distal portion of the vessel.   Left ventriculogram performed in the 30 degree right anterior oblique  projection demonstrates normal left ventricular function with a left  ventricular ejection fraction of 60%.   ASSESSMENT:  1. Critical left main disease.  2. Chronic right coronary artery occlusion.  3. Normal left ventricular function.   The patient was pain-free and stable throughout the procedure.  However,  he has critical coronary anatomy and an emergency CVTS consult was  placed for coronary bypass surgery.  The sheath  was left in place in  case the patient decompensates.  Heparin 2500 units was given at the end  of the case and he was started on a heparin drip at 1000 units per hour.  The patient was transferred to the CCU in stable but guarded condition.      Veverly Fells. Excell Seltzer, MD  Electronically Signed     MDC/MEDQ  D:  07/17/2006  T:  07/18/2006  Job:  [id number]

## 2010-12-27 ENCOUNTER — Other Ambulatory Visit: Payer: Self-pay | Admitting: Family Medicine

## 2011-01-27 ENCOUNTER — Encounter: Payer: Self-pay | Admitting: Cardiovascular Disease

## 2011-02-16 ENCOUNTER — Encounter: Payer: Self-pay | Admitting: Gastroenterology

## 2011-02-28 ENCOUNTER — Ambulatory Visit (AMBULATORY_SURGERY_CENTER): Payer: Medicare Other | Admitting: *Deleted

## 2011-02-28 VITALS — Ht 71.0 in | Wt 192.4 lb

## 2011-02-28 DIAGNOSIS — Z1211 Encounter for screening for malignant neoplasm of colon: Secondary | ICD-10-CM

## 2011-02-28 MED ORDER — SUPREP BOWEL PREP KIT 17.5-3.13-1.6 GM/177ML PO SOLN: 1.0000 | Freq: Once | ORAL | Status: DC

## 2011-03-02 ENCOUNTER — Ambulatory Visit (INDEPENDENT_AMBULATORY_CARE_PROVIDER_SITE_OTHER): Payer: Medicare Other | Admitting: Cardiovascular Disease

## 2011-03-02 ENCOUNTER — Ambulatory Visit: Payer: 59 | Admitting: Cardiovascular Disease

## 2011-03-02 ENCOUNTER — Encounter: Payer: Self-pay | Admitting: Cardiovascular Disease

## 2011-03-02 VITALS — BP 128/68 | HR 64 | Ht 69.0 in | Wt 194.0 lb

## 2011-03-02 DIAGNOSIS — I2510 Atherosclerotic heart disease of native coronary artery without angina pectoris: Secondary | ICD-10-CM

## 2011-03-02 DIAGNOSIS — I10 Essential (primary) hypertension: Secondary | ICD-10-CM

## 2011-03-02 DIAGNOSIS — E785 Hyperlipidemia, unspecified: Secondary | ICD-10-CM

## 2011-03-02 NOTE — Patient Instructions (Signed)
Your physician has requested that you have an exercise tolerance test. For further information please visit [email or web address] Please also follow instruction sheet, as given. To be done in August 2013.

## 2011-03-03 ENCOUNTER — Encounter: Payer: Self-pay | Admitting: Cardiovascular Disease

## 2011-03-03 NOTE — Assessment & Plan Note (Signed)
Blood pressure is well-controlled on metoprolol. 

## 2011-03-03 NOTE — Assessment & Plan Note (Signed)
Lipids have been at goal and LDL is less than 70. Continue statin therapy.

## 2011-03-03 NOTE — Progress Notes (Signed)
HPI:  This is a 65 year old gentleman presenting for followup evaluation. He has coronary artery disease status post coronary bypass surgery in 2007. He initially presented with acute coronary syndrome and was found to have critical left main stem stenosis. The patient continues to do well from a symptomatic perspective. He exercises regularly with weightlifting and treadmill workouts. He has no symptoms with exertion. He specifically denies chest pain or pressure. He denies dyspnea, palpitations, orthopnea, or PND.  Outpatient Encounter Prescriptions as of 03/02/2011  Medication Sig Dispense Refill  . allopurinol (ZYLOPRIM) 300 MG tablet TAKE 1 TABLET EVERY DAY  30 tablet  10  . aspirin 81 MG tablet Take 81 mg by mouth daily.        . indomethacin (INDOCIN) 50 MG capsule Take by mouth. 1 every 6 hours as needed for gout       . metoprolol (LOPRESSOR) 50 MG tablet TAKE 1 TABLET TWICE A DAY  180 tablet  1  . Multiple Vitamins-Minerals (CENTRUM PO) Take by mouth.        . naproxen (EC NAPROSYN) 500 MG EC tablet Take 500 mg by mouth 2 (two) times daily as needed.       . rosuvastatin (CRESTOR) 10 MG tablet Take 1 tablet (10 mg total) by mouth daily.  90 tablet  3  . SUPREP BOWEL PREP SOLN Take 1 kit by mouth once.  354 mL  0  . tadalafil (CIALIS) 20 MG tablet Take 20 mg by mouth daily as needed.          No Known Allergies  Past Medical History  Diagnosis Date  . CAD (coronary artery disease)     post CABG.  RBB. no symptomatic concerns  . DJD (degenerative joint disease)     neck  . Gout   . Hyperlipidemia   . Diabetes mellitus   . Hypertension   . ED (erectile dysfunction)   . Myocardial infarct 07/03/06  . Vitiligo     ROS: Negative except as per HPI  BP 128/68  Pulse 64  Ht 5\' 9"  (1.753 m)  Wt 194 lb (87.998 kg)  BMI 28.65 kg/m2  PHYSICAL EXAM: Pt is alert and oriented, NAD HEENT: normal Neck: JVP - normal, carotids 2+= without bruits Lungs: CTA bilaterally CV: RRR without  murmur or gallop Abd: soft, NT, Positive BS, no hepatomegaly Ext: no C/C/E, distal pulses intact and equal Skin: warm/dry no rash  EKG:  Normal sinus rhythm with right bundle branch block, unchanged from prior tracing.  ASSESSMENT AND PLAN:

## 2011-03-03 NOTE — Assessment & Plan Note (Signed)
The patient is stable without angina. Will continue his current medical program. Recommend an exercise tolerance test in 12 months when he follows up.

## 2011-03-14 ENCOUNTER — Ambulatory Visit (AMBULATORY_SURGERY_CENTER): Payer: Medicare Other | Admitting: Gastroenterology

## 2011-03-14 ENCOUNTER — Encounter: Payer: Self-pay | Admitting: Gastroenterology

## 2011-03-14 DIAGNOSIS — D126 Benign neoplasm of colon, unspecified: Secondary | ICD-10-CM

## 2011-03-14 DIAGNOSIS — Z1211 Encounter for screening for malignant neoplasm of colon: Secondary | ICD-10-CM

## 2011-03-14 MED ORDER — SODIUM CHLORIDE 0.9 % IV SOLN: 500.0000 mL | INTRAVENOUS | Status: DC

## 2011-03-14 NOTE — Patient Instructions (Signed)
Please refer to your blue and neon green sheets for instructions regarding diet and activity for the rest of today.  You may resume your medications as you would normally take them.  You will receive a letter in the mail in about two weeks regarding the results of the biopsy taken today.  Hemorrhoids Hemorrhoids are dilated (enlarged) veins around the rectum. Sometimes clots will form in the veins. This makes them swollen and painful. These are called thrombosed hemorrhoids. Causes of hemorrhoids include:  Pregnancy: this increases the pressure in the hemorrhoidal veins.   Constipation.   Straining to have a bowel movement.  HOME CARE INSTRUCTIONS  Eat a well balanced diet and drink 6 to 8 glasses of water every day to avoid constipation. You may also use a bulk laxative.   Avoid straining to have bowel movements.   Keep anal area dry and clean.   Only take over-the-counter or prescription medicines for pain, discomfort, or fever as directed by your caregiver.  If thrombosed:  Take hot sitz baths for 20 to 30 minutes, 3 to 4 times per day.   If the hemorrhoids are very tender and swollen, place ice packs on area as tolerated. Using ice packs between sitz baths may be helpful. Fill a plastic bag with ice and use a towel between the bag of ice and your skin.   Special creams and suppositories (Anusol, Nupercainal, Wyanoids) may be used or applied as directed.   Do not use a donut shaped pillow or sit on the toilet for long periods. This increases blood pooling and pain.   Move your bowels when your body has the urge; this will require less straining and will decrease pain and pressure.   Only take over-the-counter or prescription medicines for pain, discomfort, or fever as directed by your caregiver.  SEEK MEDICAL CARE IF:  You have increasing pain and swelling that is not controlled with your prescription.   You have uncontrolled bleeding.   You have an inability or  difficulty having a bowel movement.   You have pain or inflammation outside the area of the hemorrhoids.   You have chills and/or an increased oral temperature that lasts for 2 days or longer, or as your caregiver suggests.  MAKE SURE YOU:   Understand these instructions.   Will watch your condition.   Will get help right away if you are not doing well or get worse.  Document Released: 06/16/2000 Document Re-Released: 06/01/2008 [facility] Patient Information 2011 [place], [place].  Diverticulosis Diverticulosis is a common condition that develops when small pouches (diverticula) form in the wall of the colon. The risk of diverticulosis increases with age. It happens more often in people who eat a low-fiber diet. Most individuals with diverticulosis have no symptoms. Those individuals with symptoms usually experience belly (abdominal) pain, constipation, or loose stools (diarrhea). HOME CARE INSTRUCTIONS  Increase the amount of fiber in your diet as directed by your caregiver or dietician. This may reduce symptoms of diverticulosis.   Your caregiver may recommend taking a dietary fiber supplement.   Drink at least 6 to 8 glasses of water each day to prevent constipation.   Try not to strain when you have a bowel movement.   Your caregiver may recommend avoiding nuts and seeds to prevent complications, although this is still an uncertain benefit.   Only take over-the-counter or prescription medicines for pain, discomfort, or fever as directed by your caregiver.  FOODS HAVING HIGH FIBER CONTENT INCLUDE:  Fruits. Apple,  peach, pear, tangerine, raisins, prunes.   Vegetables. Brussels sprouts, asparagus, broccoli, cabbage, carrot, cauliflower, romaine lettuce, spinach, summer squash, tomato, winter squash, zucchini.   Starchy Vegetables. Baked beans, kidney beans, lima beans, split peas, lentils, potatoes (with skin).   Grains. Whole wheat bread, brown rice, bran flake cereal, plain  oatmeal, white rice, shredded wheat, bran muffins.  SEEK IMMEDIATE MEDICAL CARE IF:  You develop increasing pain or severe bloating.   You have an increased oral temperature, not controlled by medicine.   You develop vomiting or bowel movements that are bloody or black.  Document Released: 03/16/2004 Document Re-Released: 12/07/2009 [facility] Patient Information 2011 [place], [place].  Polyps, Colon  A polyp is extra tissue that grows inside your body. Colon polyps grow in the large intestine. The large intestine, also called the colon, is part of your digestive system. It is a long, hollow tube at the end of your digestive tract where your body makes and stores stool. Most polyps are not dangerous. They are benign. This means they are not cancerous. But over time, some types of polyps can turn into cancer. Polyps that are smaller than a pea are usually not harmful. But larger polyps could someday become or may already be cancerous. To be safe, doctors remove all polyps and test them.  WHO GETS POLYPS? Anyone can get polyps, but certain people are more likely than others. You may have a greater chance of getting polyps if:  You are over 50.   You have had polyps before.   Someone in your family has had polyps.   Someone in your family has had cancer of the large intestine.   Find out if someone in your family has had polyps. You may also be more likely to get polyps if you:   Eat a lot of fatty foods   Smoke   Drink alcohol   Do not exercise  Eat too much  SYMPTOMS Most small polyps do not cause symptoms. People often do not know they have one until their caregiver finds it during a regular checkup or while testing them for something else. Some people do have symptoms like these:  Bleeding from the anus. You might notice blood on your underwear or on toilet paper after you have had a bowel movement.   Constipation or diarrhea that lasts more than a week.   Blood in the stool.  Blood can make stool look black or it can show up as red streaks in the stool.  If you have any of these symptoms, see your caregiver. HOW DOES THE DOCTOR TEST FOR POLYPS? The doctor can use four tests to check for polyps:  Digital rectal exam. The caregiver wears gloves and checks your rectum (the last part of the large intestine) to see if it feels normal. This test would find polyps only in the rectum. Your caregiver may need to do one of the other tests listed below to find polyps higher up in the intestine.   Barium enema. The caregiver puts a liquid called barium into your rectum before taking x-rays of your large intestine. Barium makes your intestine look white in the pictures. Polyps are dark, so they are easy to see.   Sigmoidoscopy. With this test, the caregiver can see inside your large intestine. A thin flexible tube is placed into your rectum. The device is called a sigmoidoscope, which has a light and a tiny video camera in it. The caregiver uses the sigmoidoscope to look at the last  third of your large intestine.   Colonoscopy. This test is like sigmoidoscopy, but the caregiver looks at all of the large intestine. It usually requires sedation. This is the most common method for finding and removing polyps.  TREATMENT  The caregiver will remove the polyp during sigmoidoscopy or colonoscopy. The polyp is then tested for cancer.   If you have had polyps, your caregiver may want you to get tested regularly in the future.  PREVENTION There is not one sure way to prevent polyps. You might be able to lower your risk of getting them if you:  Eat more fruits and vegetables and less fatty food.   Do not smoke.   Avoid alcohol.   Exercise every day.   Lose weight if you are overweight.   Eating more calcium and folate can also lower your risk of getting polyps. Some foods that are rich in calcium are milk, cheese, and broccoli. Some foods that are rich in folate are chickpeas,  kidney beans, and spinach.   Aspirin might help prevent polyps. Studies are under way.  Document Released: 03/15/2004 Document Re-Released: 12/07/2009 [facility] Patient Information 2011 [place], [place].

## 2011-03-15 ENCOUNTER — Telehealth: Payer: Self-pay | Admitting: *Deleted

## 2011-03-15 NOTE — Telephone Encounter (Signed)

## 2011-03-20 ENCOUNTER — Encounter: Payer: Self-pay | Admitting: Gastroenterology

## 2011-04-21 ENCOUNTER — Other Ambulatory Visit: Payer: Self-pay | Admitting: Cardiovascular Disease

## 2011-07-20 ENCOUNTER — Other Ambulatory Visit: Payer: Self-pay | Admitting: Cardiovascular Disease

## 2011-11-06 ENCOUNTER — Other Ambulatory Visit: Payer: Self-pay | Admitting: Cardiovascular Disease

## 2011-12-26 ENCOUNTER — Ambulatory Visit (INDEPENDENT_AMBULATORY_CARE_PROVIDER_SITE_OTHER): Payer: Medicare Other | Admitting: Family Medicine

## 2011-12-26 ENCOUNTER — Encounter: Payer: Self-pay | Admitting: Family Medicine

## 2011-12-26 VITALS — BP 120/74 | HR 64 | Temp 98.5°F | Ht 69.5 in | Wt 194.0 lb

## 2011-12-26 DIAGNOSIS — E119 Type 2 diabetes mellitus without complications: Secondary | ICD-10-CM | POA: Diagnosis not present

## 2011-12-26 DIAGNOSIS — M109 Gout, unspecified: Secondary | ICD-10-CM | POA: Diagnosis not present

## 2011-12-26 DIAGNOSIS — N401 Enlarged prostate with lower urinary tract symptoms: Secondary | ICD-10-CM | POA: Diagnosis not present

## 2011-12-26 DIAGNOSIS — M1990 Unspecified osteoarthritis, unspecified site: Secondary | ICD-10-CM

## 2011-12-26 DIAGNOSIS — N138 Other obstructive and reflux uropathy: Secondary | ICD-10-CM

## 2011-12-26 DIAGNOSIS — I2510 Atherosclerotic heart disease of native coronary artery without angina pectoris: Secondary | ICD-10-CM

## 2011-12-26 DIAGNOSIS — I10 Essential (primary) hypertension: Secondary | ICD-10-CM

## 2011-12-26 DIAGNOSIS — N139 Obstructive and reflux uropathy, unspecified: Secondary | ICD-10-CM | POA: Diagnosis not present

## 2011-12-26 LAB — LIPID PANEL
Cholesterol: 125 mg/dL (ref 0–200)
HDL: 38.5 mg/dL — ABNORMAL LOW (ref >39.00)
LDL Cholesterol: 72 mg/dL (ref 0–99)
Triglycerides: 72 mg/dL (ref 0.0–149.0)

## 2011-12-26 LAB — BASIC METABOLIC PANEL
BUN: 20 mg/dL (ref 6–23)
CO2: 26 mEq/L (ref 19–32)
Calcium: 9.7 mg/dL (ref 8.4–10.5)
Chloride: 106 mEq/L (ref 96–112)
Creatinine, Ser: 1.1 mg/dL (ref 0.4–1.5)

## 2011-12-26 LAB — POCT URINALYSIS DIPSTICK
Nitrite, UA: NEGATIVE
Protein, UA: NEGATIVE
Urobilinogen, UA: 0.2
pH, UA: 6

## 2011-12-26 LAB — HEPATIC FUNCTION PANEL
ALT: 21 U/L (ref 0–53)
AST: 25 U/L (ref 0–37)
Albumin: 4.4 g/dL (ref 3.5–5.2)

## 2011-12-26 LAB — CBC WITH DIFFERENTIAL/PLATELET
Basophils Absolute: 0 10*3/uL (ref 0.0–0.1)
Eosinophils Absolute: 0.2 10*3/uL (ref 0.0–0.7)
Lymphocytes Relative: 20 % (ref 12.0–46.0)
MCHC: 33.3 g/dL (ref 30.0–36.0)
Neutro Abs: 3.9 10*3/uL (ref 1.4–7.7)
Neutrophils Relative %: 67 % (ref 43.0–77.0)
Platelets: 171 10*3/uL (ref 150.0–400.0)
RDW: 14.5 % (ref 11.5–14.6)

## 2011-12-26 LAB — HEMOGLOBIN A1C: Hgb A1c MFr Bld: 5.5 % (ref 4.6–6.5)

## 2011-12-26 LAB — URIC ACID: Uric Acid, Serum: 5.3 mg/dL (ref 4.0–7.8)

## 2011-12-26 MED ORDER — ALLOPURINOL 300 MG PO TABS: 300.0000 mg | ORAL_TABLET | Freq: Every day | ORAL | Status: DC

## 2011-12-26 MED ORDER — NAPROXEN 500 MG PO TBEC: 500.0000 mg | DELAYED_RELEASE_TABLET | Freq: Two times a day (BID) | ORAL | Status: DC | PRN

## 2011-12-26 NOTE — Progress Notes (Signed)
  Subjective:    Patient ID: Kevin Mcbride, male    DOB: February 21, 1946, 66 y.o.   MRN: [id number]  HPI 66 yr old male for a cpx. He feels fine and has no concerns. He and his wife will be leaving for a trip to [place] in a week or so. He will see Dr. Excell Seltzer again in August.    Review of Systems  Constitutional: Negative.   HENT: Negative.   Eyes: Negative.   Respiratory: Negative.   Cardiovascular: Negative.   Gastrointestinal: Negative.   Genitourinary: Negative.   Musculoskeletal: Negative.   Skin: Negative.   Neurological: Negative.   Hematological: Negative.   Psychiatric/Behavioral: Negative.        Objective:   Physical Exam  Constitutional: He is oriented to person, place, and time. He appears well-developed and well-nourished. No distress.  HENT:  Head: Normocephalic and atraumatic.  Right Ear: External ear normal.  Left Ear: External ear normal.  Nose: Nose normal.  Mouth/Throat: Oropharynx is clear and moist. No oropharyngeal exudate.  Eyes: Conjunctivae and EOM are normal. Pupils are equal, round, and reactive to light. Right eye exhibits no discharge. Left eye exhibits no discharge. No scleral icterus.  Neck: Neck supple. No JVD present. No tracheal deviation present. No thyromegaly present.  Cardiovascular: Normal rate, regular rhythm, normal heart sounds and intact distal pulses.  Exam reveals no gallop and no friction rub.   No murmur heard. Pulmonary/Chest: Effort normal and breath sounds normal. No respiratory distress. He has no wheezes. He has no rales. He exhibits no tenderness.  Abdominal: Soft. Bowel sounds are normal. He exhibits no distension and no mass. There is no tenderness. There is no rebound and no guarding.  Genitourinary: Rectum normal, prostate normal and penis normal. Guaiac negative stool. No penile tenderness.  Musculoskeletal: Normal range of motion. He exhibits no edema and no tenderness.  Lymphadenopathy:    He has no cervical adenopathy.   Neurological: He is alert and oriented to person, place, and time. He has normal reflexes. No cranial nerve deficit. He exhibits normal muscle tone. Coordination normal.  Skin: Skin is warm and dry. No rash noted. He is not diaphoretic. No erythema. No pallor.  Psychiatric: He has a normal mood and affect. His behavior is normal. Judgment and thought content normal.          Assessment & Plan:  Well exam. Get fasting labs.

## 2011-12-27 NOTE — Progress Notes (Signed)
Quick Note:  I left voice message with normal results. ______ 

## 2012-02-19 ENCOUNTER — Telehealth: Payer: Self-pay | Admitting: Cardiovascular Disease

## 2012-02-19 NOTE — Telephone Encounter (Signed)
New msg Patient wants to discuss appointment he has on Wed. Please call

## 2012-02-19 NOTE — Telephone Encounter (Signed)
Pt calling today to cancel gxt on Wednesday but keep the yearly appt with Dr. Excell Seltzer as scheduled on Wednesday He injured is achilles tendon last Friday and is still limping although improved from yesterday. Have sent a message to Lela to cancel gxt & just yearly appt. Mylo Red RN

## 2012-02-21 ENCOUNTER — Ambulatory Visit (INDEPENDENT_AMBULATORY_CARE_PROVIDER_SITE_OTHER): Payer: Medicare Other | Admitting: Cardiovascular Disease

## 2012-02-21 ENCOUNTER — Encounter: Payer: Self-pay | Admitting: Cardiovascular Disease

## 2012-02-21 ENCOUNTER — Encounter: Payer: Medicare Other | Admitting: Cardiovascular Disease

## 2012-02-21 VITALS — BP 106/66 | HR 67 | Ht 70.0 in | Wt 192.0 lb

## 2012-02-21 DIAGNOSIS — I2510 Atherosclerotic heart disease of native coronary artery without angina pectoris: Secondary | ICD-10-CM

## 2012-02-21 DIAGNOSIS — E785 Hyperlipidemia, unspecified: Secondary | ICD-10-CM

## 2012-02-21 DIAGNOSIS — I10 Essential (primary) hypertension: Secondary | ICD-10-CM | POA: Diagnosis not present

## 2012-02-21 NOTE — Assessment & Plan Note (Signed)
The patient's lipids have been well-controlled on low-dose Crestor. His most recent LDL cholesterol was 72. His HDL is less than 40. We will contact him about consideration of the ACCELERATE Trial.

## 2012-02-21 NOTE — Progress Notes (Signed)
   HPI:  66 year old gentleman presenting for followup evaluation. The patient is followed for coronary disease with previous urgent coronary bypass surgery in 2007. He was treated in the setting of acute coronary syndrome and critical left main disease.  He continues to do very well from a symptomatic perspective. He walks regularly for exercise on the treadmill 4 miles per hour about 5 days per week. He's had some problems with his Achilles tendon, but reports no cardiopulmonary symptoms. He specifically denies chest pain, chest pressure, dyspnea, edema, or palpitations. He's been compliant with his medications.  Outpatient Encounter Prescriptions as of 02/21/2012  Medication Sig Dispense Refill  . allopurinol (ZYLOPRIM) 300 MG tablet Take 1 tablet (300 mg total) by mouth daily.  30 tablet  11  . aspirin 81 MG tablet Take 81 mg by mouth daily.        . CRESTOR 10 MG tablet TAKE 1 TABLET DAILY  90 tablet  2  . indomethacin (INDOCIN) 50 MG capsule Take by mouth. 1 every 6 hours as needed for gout       . metoprolol (LOPRESSOR) 50 MG tablet TAKE 1 TABLET TWICE A DAY  180 tablet  6  . Multiple Vitamins-Minerals (CENTRUM PO) Take by mouth.        . naproxen (EC NAPROSYN) 500 MG EC tablet Take 1 tablet (500 mg total) by mouth 2 (two) times daily as needed (pain).  60 tablet  11  . tadalafil (CIALIS) 20 MG tablet Take 20 mg by mouth daily as needed.          No Known Allergies  Past Medical History  Diagnosis Date  . CAD (coronary artery disease)     post CABG.  RBB. no symptomatic concerns, sees Dr. Tonny Bollman   . DJD (degenerative joint disease)     neck  . Gout   . Hyperlipidemia   . Diabetes mellitus   . Hypertension   . ED (erectile dysfunction)   . Myocardial infarct 07/03/06  . Vitiligo     sees Dr. Brigid Re     ROS: Negative except as per HPI  BP 106/66  Pulse 67  Ht 5\' 10"  (1.778 m)  Wt 87.091 kg (192 lb)  BMI 27.55 kg/m2  PHYSICAL EXAM: Pt is alert and oriented,  NAD HEENT: normal Neck: JVP - normal, carotids 2+= without bruits Lungs: CTA bilaterally CV: RRR without murmur or gallop Abd: soft, NT, Positive BS, no hepatomegaly Ext: no C/C/E, distal pulses intact and equal Skin: warm/dry no rash  EKG:  Sinus rhythm 67 beats per minute, occasional PVC, right bundle branch block, no significant change from previous tracing.  ASSESSMENT AND PLAN:

## 2012-02-21 NOTE — Assessment & Plan Note (Signed)
The patient is stable without anginal symptoms. We have previously discussed an exercise treadmill study for surveillance, but I think the diagnostic yield would be fairly low considering his lack of symptoms and wide right bundle branch block pattern on EKG. I'll see him back in 12 months for routine followup. We will continue his same medications.

## 2012-02-21 NOTE — Assessment & Plan Note (Signed)
Blood pressure control is excellent. The patient is on metoprolol twice daily.

## 2012-02-21 NOTE — Patient Instructions (Addendum)
Your physician wants you to follow-up in: 1 year with Dr. Cooper.  You will receive a reminder letter in the mail two months in advance. If you don't receive a letter, please call our office to schedule the follow-up appointment.  

## 2012-03-07 ENCOUNTER — Telehealth: Payer: Self-pay | Admitting: Family Medicine

## 2012-03-07 NOTE — Telephone Encounter (Signed)
Pt will be transferring all rxs to new pharm [facility]

## 2012-03-07 NOTE — Telephone Encounter (Signed)
I spoke with pt and changed pharmacy in computer.

## 2012-03-13 ENCOUNTER — Other Ambulatory Visit: Payer: Self-pay | Admitting: Cardiovascular Disease

## 2012-03-14 MED ORDER — ROSUVASTATIN CALCIUM 10 MG PO TABS: 10.0000 mg | ORAL_TABLET | Freq: Every day | ORAL | Status: DC

## 2012-03-14 MED ORDER — METOPROLOL TARTRATE 50 MG PO TABS: 50.0000 mg | ORAL_TABLET | Freq: Two times a day (BID) | ORAL | Status: DC

## 2012-03-25 DIAGNOSIS — Z23 Encounter for immunization: Secondary | ICD-10-CM | POA: Diagnosis not present

## 2012-12-27 ENCOUNTER — Ambulatory Visit (INDEPENDENT_AMBULATORY_CARE_PROVIDER_SITE_OTHER): Payer: Medicare Other | Admitting: Family Medicine

## 2012-12-27 ENCOUNTER — Encounter: Payer: Self-pay | Admitting: Family Medicine

## 2012-12-27 VITALS — BP 118/74 | HR 60 | Temp 97.5°F | Ht 69.5 in | Wt 190.0 lb

## 2012-12-27 DIAGNOSIS — M109 Gout, unspecified: Secondary | ICD-10-CM

## 2012-12-27 DIAGNOSIS — E119 Type 2 diabetes mellitus without complications: Secondary | ICD-10-CM | POA: Diagnosis not present

## 2012-12-27 DIAGNOSIS — I10 Essential (primary) hypertension: Secondary | ICD-10-CM | POA: Diagnosis not present

## 2012-12-27 DIAGNOSIS — N139 Obstructive and reflux uropathy, unspecified: Secondary | ICD-10-CM | POA: Diagnosis not present

## 2012-12-27 DIAGNOSIS — I2510 Atherosclerotic heart disease of native coronary artery without angina pectoris: Secondary | ICD-10-CM

## 2012-12-27 DIAGNOSIS — I252 Old myocardial infarction: Secondary | ICD-10-CM | POA: Diagnosis not present

## 2012-12-27 DIAGNOSIS — N401 Enlarged prostate with lower urinary tract symptoms: Secondary | ICD-10-CM | POA: Diagnosis not present

## 2012-12-27 DIAGNOSIS — E785 Hyperlipidemia, unspecified: Secondary | ICD-10-CM

## 2012-12-27 LAB — LIPID PANEL
Cholesterol: 129 mg/dL (ref 0–200)
HDL: 39.7 mg/dL (ref >39.00)
Triglycerides: 88 mg/dL (ref 0.0–149.0)

## 2012-12-27 LAB — CBC WITH DIFFERENTIAL/PLATELET
Basophils Absolute: 0 10*3/uL (ref 0.0–0.1)
Eosinophils Absolute: 0.2 10*3/uL (ref 0.0–0.7)
HCT: 48.7 % (ref 39.0–52.0)
Hemoglobin: 16.5 g/dL (ref 13.0–17.0)
Lymphs Abs: 1.2 10*3/uL (ref 0.7–4.0)
MCHC: 34 g/dL (ref 30.0–36.0)
Monocytes Absolute: 0.6 10*3/uL (ref 0.1–1.0)
Neutro Abs: 4.2 10*3/uL (ref 1.4–7.7)
Platelets: 177 10*3/uL (ref 150.0–400.0)
RDW: 14.7 % — ABNORMAL HIGH (ref 11.5–14.6)

## 2012-12-27 LAB — POCT URINALYSIS DIPSTICK
Ketones, UA: NEGATIVE
Protein, UA: NEGATIVE
Spec Grav, UA: 1.01
Urobilinogen, UA: 0.2
pH, UA: 6.5

## 2012-12-27 LAB — BASIC METABOLIC PANEL
CO2: 29 mEq/L (ref 19–32)
Calcium: 10 mg/dL (ref 8.4–10.5)
Chloride: 102 mEq/L (ref 96–112)
Creatinine, Ser: 1.1 mg/dL (ref 0.4–1.5)
Glucose, Bld: 114 mg/dL — ABNORMAL HIGH (ref 70–99)

## 2012-12-27 LAB — HEPATIC FUNCTION PANEL
ALT: 19 U/L (ref 0–53)
AST: 24 U/L (ref 0–37)
Bilirubin, Direct: 0.2 mg/dL (ref 0.0–0.3)
Total Bilirubin: 1 mg/dL (ref 0.3–1.2)
Total Protein: 7.3 g/dL (ref 6.0–8.3)

## 2012-12-27 LAB — URIC ACID: Uric Acid, Serum: 6.4 mg/dL (ref 4.0–7.8)

## 2012-12-27 LAB — TSH: TSH: 2.04 u[IU]/mL (ref 0.35–5.50)

## 2012-12-27 MED ORDER — ALLOPURINOL 300 MG PO TABS: 300.0000 mg | ORAL_TABLET | Freq: Every day | ORAL | Status: DC

## 2012-12-27 NOTE — Progress Notes (Signed)
  Subjective:    Patient ID: Kevin Mcbride, male    DOB: 1946/01/23, 67 y.o.   MRN: [id number]  HPI 67 yr old male for a cpx. He feels well. He has had no gout flares at all in the past year.    Review of Systems  Constitutional: Negative.   HENT: Negative.   Eyes: Negative.   Respiratory: Negative.   Cardiovascular: Negative.   Gastrointestinal: Negative.   Genitourinary: Negative.   Musculoskeletal: Negative.   Skin: Negative.   Neurological: Negative.   Psychiatric/Behavioral: Negative.        Objective:   Physical Exam  Constitutional: He is oriented to person, place, and time. He appears well-developed and well-nourished. No distress.  HENT:  Head: Normocephalic and atraumatic.  Right Ear: External ear normal.  Left Ear: External ear normal.  Nose: Nose normal.  Mouth/Throat: Oropharynx is clear and moist. No oropharyngeal exudate.  Eyes: Conjunctivae and EOM are normal. Pupils are equal, round, and reactive to light. Right eye exhibits no discharge. Left eye exhibits no discharge. No scleral icterus.  Neck: Neck supple. No JVD present. No tracheal deviation present. No thyromegaly present.  Cardiovascular: Normal rate, regular rhythm, normal heart sounds and intact distal pulses.  Exam reveals no gallop and no friction rub.   No murmur heard. Pulmonary/Chest: Effort normal and breath sounds normal. No respiratory distress. He has no wheezes. He has no rales. He exhibits no tenderness.  Abdominal: Soft. Bowel sounds are normal. He exhibits no distension and no mass. There is no tenderness. There is no rebound and no guarding.  Genitourinary: Rectum normal, prostate normal and penis normal. Guaiac negative stool. No penile tenderness.  Musculoskeletal: Normal range of motion. He exhibits no edema and no tenderness.  Lymphadenopathy:    He has no cervical adenopathy.  Neurological: He is alert and oriented to person, place, and time. He has normal reflexes. No cranial  nerve deficit. He exhibits normal muscle tone. Coordination normal.  Skin: Skin is warm and dry. No rash noted. He is not diaphoretic. No erythema. No pallor.  Psychiatric: He has a normal mood and affect. His behavior is normal. Judgment and thought content normal.          Assessment & Plan:  Well exam. Get fasting labs

## 2012-12-27 NOTE — Progress Notes (Signed)
Quick Note:  I left voice message with results. ______ 

## 2013-02-05 ENCOUNTER — Other Ambulatory Visit: Payer: Self-pay

## 2013-02-25 ENCOUNTER — Ambulatory Visit: Payer: PRIVATE HEALTH INSURANCE | Admitting: Cardiovascular Disease

## 2013-03-10 ENCOUNTER — Other Ambulatory Visit: Payer: Self-pay | Admitting: *Deleted

## 2013-03-10 MED ORDER — METOPROLOL TARTRATE 50 MG PO TABS: 50.0000 mg | ORAL_TABLET | Freq: Two times a day (BID) | ORAL | Status: DC

## 2013-03-10 MED ORDER — ROSUVASTATIN CALCIUM 10 MG PO TABS: 10.0000 mg | ORAL_TABLET | Freq: Every day | ORAL | Status: DC

## 2013-03-27 DIAGNOSIS — Z23 Encounter for immunization: Secondary | ICD-10-CM | POA: Diagnosis not present

## 2013-05-08 ENCOUNTER — Other Ambulatory Visit: Payer: Self-pay

## 2013-05-13 ENCOUNTER — Encounter: Payer: Self-pay | Admitting: Cardiovascular Disease

## 2013-05-13 ENCOUNTER — Ambulatory Visit (INDEPENDENT_AMBULATORY_CARE_PROVIDER_SITE_OTHER): Payer: Medicare Other | Admitting: Cardiovascular Disease

## 2013-05-13 VITALS — BP 128/76 | HR 56 | Ht 70.0 in | Wt 189.0 lb

## 2013-05-13 DIAGNOSIS — I2510 Atherosclerotic heart disease of native coronary artery without angina pectoris: Secondary | ICD-10-CM | POA: Diagnosis not present

## 2013-05-13 DIAGNOSIS — E785 Hyperlipidemia, unspecified: Secondary | ICD-10-CM

## 2013-05-13 DIAGNOSIS — I10 Essential (primary) hypertension: Secondary | ICD-10-CM

## 2013-05-13 MED ORDER — ROSUVASTATIN CALCIUM 10 MG PO TABS: 10.0000 mg | ORAL_TABLET | Freq: Every day | ORAL | Status: DC

## 2013-05-13 MED ORDER — METOPROLOL TARTRATE 50 MG PO TABS: 50.0000 mg | ORAL_TABLET | Freq: Two times a day (BID) | ORAL | Status: DC

## 2013-05-13 NOTE — Patient Instructions (Signed)
Your physician wants you to follow-up in: 1 YEAR with Dr Cooper.  You will receive a reminder letter in the mail two months in advance. If you don't receive a letter, please call our office to schedule the follow-up appointment.  Your physician recommends that you continue on your current medications as directed. Please refer to the Current Medication list given to you today.  

## 2013-05-13 NOTE — Progress Notes (Signed)
    HPI:   67 year old gentleman presenting for followup evaluation. The patient is followed for coronary disease with previous urgent coronary bypass surgery in 2007. He was treated in the setting of acute coronary syndrome and critical left main disease. Lipids from June 2014 or excellent with a cholesterol of 129, HDL 40, and LDL 72. His triglycerides were 88. Liver function tests were within normal limits.  The patient is doing very well from a cardiac perspective. He has no chest pain, chest pressure, shortness of breath, or weakness. He has mild limitation from an Achilles injury. He is exercising regularly without exertional symptoms. He rides a recumbent bike and also does cross training exercises at the gym.  Outpatient Encounter Prescriptions as of 05/13/2013  Medication Sig  . allopurinol (ZYLOPRIM) 300 MG tablet Take 1 tablet (300 mg total) by mouth daily.  Marland Kitchen aspirin 81 MG tablet Take 81 mg by mouth daily.    . indomethacin (INDOCIN) 50 MG capsule Take by mouth. 1 every 6 hours as needed for gout   . metoprolol (LOPRESSOR) 50 MG tablet Take 1 tablet (50 mg total) by mouth 2 (two) times daily.  . Multiple Vitamins-Minerals (CENTRUM PO) Take by mouth.    . naproxen (EC NAPROSYN) 500 MG EC tablet Take 1 tablet (500 mg total) by mouth 2 (two) times daily as needed (pain).  . rosuvastatin (CRESTOR) 10 MG tablet Take 1 tablet (10 mg total) by mouth daily.  . tadalafil (CIALIS) 20 MG tablet Take 20 mg by mouth daily as needed.      No Known Allergies  Past Medical History  Diagnosis Date  . CAD (coronary artery disease)     post CABG.  RBB. no symptomatic concerns, sees Dr. Tonny Bollman   . DJD (degenerative joint disease)     neck  . Gout   . Hyperlipidemia   . Diabetes mellitus   . Hypertension   . ED (erectile dysfunction)   . Myocardial infarct 07/03/06  . Vitiligo     sees Dr. Brigid Re     BP 128/76  Pulse 56  Ht 5\' 10"  (1.778 m)  Wt 189 lb (85.73 kg)  BMI 27.12  kg/m2  PHYSICAL EXAM: Pt is alert and oriented, NAD HEENT: normal Neck: JVP - normal, carotids 2+= without bruits Lungs: CTA bilaterally CV: RRR without murmur or gallop Abd: soft, NT, Positive BS, no hepatomegaly Ext: no C/C/E, distal pulses intact and equal Skin: warm/dry no rash  EKG:  Sinus bradycardia 56 beats per minute, right bundle branch block. No significant change from previous tracing.  ASSESSMENT AND PLAN: 1. Coronary atherosclerosis, native vessel. The patient has undergone CABG. He is asymptomatic. He will followup in one year and continue on his current medical therapy which was reviewed today. He is on aspirin, a beta blocker, and a statin drug.  2. Hyperlipidemia. Lipids reviewed as above and he remains on rosuvastatin 10 mg daily.  For followup I will see him back in one year. Lipids and blood pressure are ideal on his current therapy.  Tonny Bollman 05/13/2013 9:29 AM

## 2013-05-27 ENCOUNTER — Telehealth: Payer: Self-pay | Admitting: Family Medicine

## 2013-05-27 NOTE — Telephone Encounter (Signed)
Refill request for Naproxen & Allopurinol and send to [facility]. Per Dr. Clent Ridges, okay to refill.

## 2013-05-28 MED ORDER — NAPROXEN 500 MG PO TBEC: 500.0000 mg | DELAYED_RELEASE_TABLET | Freq: Two times a day (BID) | ORAL | Status: DC | PRN

## 2013-05-28 MED ORDER — ALLOPURINOL 300 MG PO TABS: 300.0000 mg | ORAL_TABLET | Freq: Every day | ORAL | Status: DC

## 2013-05-28 NOTE — Telephone Encounter (Signed)
I sent both scripts e-scribe. 

## 2013-06-18 ENCOUNTER — Telehealth: Payer: Self-pay | Admitting: Family Medicine

## 2013-06-18 ENCOUNTER — Other Ambulatory Visit: Payer: Self-pay | Admitting: Family Medicine

## 2013-06-18 MED ORDER — ZOSTER VACCINE LIVE 19400 UNT/0.65ML ~~LOC~~ SOLR: 0.6500 mL | Freq: Once | SUBCUTANEOUS | Status: DC

## 2013-06-18 NOTE — Telephone Encounter (Signed)
Pt would like rx for shingle vaccine fax to [facility] [address]

## 2013-06-18 NOTE — Telephone Encounter (Signed)
Sent by e-scribe.  Pt notified. 

## 2013-12-22 ENCOUNTER — Telehealth: Payer: Self-pay | Admitting: Family Medicine

## 2013-12-22 MED ORDER — ALLOPURINOL 300 MG PO TABS: 300.0000 mg | ORAL_TABLET | Freq: Every day | ORAL | Status: DC

## 2013-12-22 NOTE — Telephone Encounter (Signed)
Pt needs rx allopurinol (ZYLOPRIM) 300 MG tablet Sent to local pharm due to going out of town on Friday. Pt has not enough time to get from [facility]. Can you [facility]?

## 2013-12-22 NOTE — Telephone Encounter (Signed)
I sent script e-scribe. 

## 2014-02-23 ENCOUNTER — Other Ambulatory Visit: Payer: Self-pay | Admitting: Family Medicine

## 2014-03-30 ENCOUNTER — Encounter: Payer: Self-pay | Admitting: Family Medicine

## 2014-03-30 ENCOUNTER — Ambulatory Visit (INDEPENDENT_AMBULATORY_CARE_PROVIDER_SITE_OTHER): Payer: Medicare Other | Admitting: Family Medicine

## 2014-03-30 VITALS — BP 124/69 | HR 51 | Temp 98.1°F | Ht 69.5 in | Wt 188.0 lb

## 2014-03-30 DIAGNOSIS — E119 Type 2 diabetes mellitus without complications: Secondary | ICD-10-CM

## 2014-03-30 DIAGNOSIS — I2510 Atherosclerotic heart disease of native coronary artery without angina pectoris: Secondary | ICD-10-CM

## 2014-03-30 DIAGNOSIS — N138 Other obstructive and reflux uropathy: Secondary | ICD-10-CM | POA: Diagnosis not present

## 2014-03-30 DIAGNOSIS — N401 Enlarged prostate with lower urinary tract symptoms: Secondary | ICD-10-CM | POA: Diagnosis not present

## 2014-03-30 DIAGNOSIS — M109 Gout, unspecified: Secondary | ICD-10-CM | POA: Diagnosis not present

## 2014-03-30 DIAGNOSIS — N139 Obstructive and reflux uropathy, unspecified: Secondary | ICD-10-CM | POA: Diagnosis not present

## 2014-03-30 DIAGNOSIS — I252 Old myocardial infarction: Secondary | ICD-10-CM

## 2014-03-30 DIAGNOSIS — I10 Essential (primary) hypertension: Secondary | ICD-10-CM | POA: Diagnosis not present

## 2014-03-30 DIAGNOSIS — E785 Hyperlipidemia, unspecified: Secondary | ICD-10-CM | POA: Diagnosis not present

## 2014-03-30 DIAGNOSIS — Z87898 Personal history of other specified conditions: Secondary | ICD-10-CM

## 2014-03-30 LAB — MICROALBUMIN / CREATININE URINE RATIO
CREATININE, U: 151 mg/dL
MICROALB UR: 0.6 mg/dL (ref 0.0–1.9)
Microalb Creat Ratio: 0.4 mg/g (ref 0.0–30.0)

## 2014-03-30 LAB — HEPATIC FUNCTION PANEL
ALT: 19 U/L (ref 0–53)
AST: 28 U/L (ref 0–37)
Albumin: 5 g/dL (ref 3.5–5.2)
Alkaline Phosphatase: 47 U/L (ref 39–117)
Bilirubin, Direct: 0.2 mg/dL (ref 0.0–0.3)
Total Bilirubin: 0.9 mg/dL (ref 0.2–1.2)
Total Protein: 7.5 g/dL (ref 6.0–8.3)

## 2014-03-30 LAB — BASIC METABOLIC PANEL
BUN: 24 mg/dL — AB (ref 6–23)
CALCIUM: 9.9 mg/dL (ref 8.4–10.5)
CO2: 26 meq/L (ref 19–32)
Chloride: 103 mEq/L (ref 96–112)
Creatinine, Ser: 1.2 mg/dL (ref 0.4–1.5)
GFR: 62.66 mL/min (ref >60.00)
GLUCOSE: 93 mg/dL (ref 70–99)
Potassium: 5.2 mEq/L — ABNORMAL HIGH (ref 3.5–5.1)
Sodium: 139 mEq/L (ref 135–145)

## 2014-03-30 LAB — CBC WITH DIFFERENTIAL/PLATELET
Basophils Absolute: 0 K/uL (ref 0.0–0.1)
Basophils Relative: 0.5 % (ref 0.0–3.0)
Eosinophils Absolute: 0.2 K/uL (ref 0.0–0.7)
Eosinophils Relative: 3.8 % (ref 0.0–5.0)
HCT: 49.2 % (ref 39.0–52.0)
Hemoglobin: 16.4 g/dL (ref 13.0–17.0)
Lymphocytes Relative: 22.2 % (ref 12.0–46.0)
Lymphs Abs: 1.3 K/uL (ref 0.7–4.0)
MCHC: 33.4 g/dL (ref 30.0–36.0)
MCV: 89.4 fl (ref 78.0–100.0)
Monocytes Absolute: 0.6 K/uL (ref 0.1–1.0)
Monocytes Relative: 10.1 % (ref 3.0–12.0)
Neutro Abs: 3.8 K/uL (ref 1.4–7.7)
Neutrophils Relative %: 63.4 % (ref 43.0–77.0)
Platelets: 187 K/uL (ref 150.0–400.0)
RBC: 5.5 Mil/uL (ref 4.22–5.81)
RDW: 14.8 % (ref 11.5–15.5)
WBC: 5.9 K/uL (ref 4.0–10.5)

## 2014-03-30 LAB — LIPID PANEL
Cholesterol: 147 mg/dL (ref 0–200)
HDL: 37.2 mg/dL — ABNORMAL LOW (ref >39.00)
LDL Cholesterol: 95 mg/dL (ref 0–99)
NonHDL: 109.8
Total CHOL/HDL Ratio: 4
Triglycerides: 76 mg/dL (ref 0.0–149.0)
VLDL: 15.2 mg/dL (ref 0.0–40.0)

## 2014-03-30 LAB — URIC ACID: Uric Acid, Serum: 6.2 mg/dL (ref 4.0–7.8)

## 2014-03-30 LAB — HEMOGLOBIN A1C: Hgb A1c MFr Bld: 5.4 % (ref 4.6–6.5)

## 2014-03-30 LAB — TSH: TSH: 3.39 u[IU]/mL (ref 0.35–4.50)

## 2014-03-30 LAB — PSA: PSA: 0.79 ng/mL (ref 0.10–4.00)

## 2014-03-30 NOTE — Progress Notes (Signed)
Pre visit review using our clinic review tool, if applicable. No additional management support is needed unless otherwise documented below in the visit note. 

## 2014-03-30 NOTE — Progress Notes (Signed)
   Subjective:    Patient ID: Kevin Mcbride, male    DOB: 10-20-1945, 68 y.o.   MRN: [id number]  HPI 68 yr old male for a cpx. He feels well.    Review of Systems  Constitutional: Negative.   HENT: Negative.   Eyes: Negative.   Respiratory: Negative.   Cardiovascular: Negative.   Gastrointestinal: Negative.   Genitourinary: Negative.   Musculoskeletal: Negative.   Skin: Negative.   Neurological: Negative.   Psychiatric/Behavioral: Negative.        Objective:   Physical Exam  Constitutional: He is oriented to person, place, and time. He appears well-developed and well-nourished. No distress.  HENT:  Head: Normocephalic and atraumatic.  Right Ear: External ear normal.  Left Ear: External ear normal.  Nose: Nose normal.  Mouth/Throat: Oropharynx is clear and moist. No oropharyngeal exudate.  Eyes: Conjunctivae and EOM are normal. Pupils are equal, round, and reactive to light. Right eye exhibits no discharge. Left eye exhibits no discharge. No scleral icterus.  Neck: Neck supple. No JVD present. No tracheal deviation present. No thyromegaly present.  Cardiovascular: Normal rate, regular rhythm, normal heart sounds and intact distal pulses.  Exam reveals no gallop and no friction rub.   No murmur heard. Pulmonary/Chest: Effort normal and breath sounds normal. No respiratory distress. He has no wheezes. He has no rales. He exhibits no tenderness.  Abdominal: Soft. Bowel sounds are normal. He exhibits no distension and no mass. There is no tenderness. There is no rebound and no guarding.  Genitourinary: Rectum normal, prostate normal and penis normal. Guaiac negative stool. No penile tenderness.  Musculoskeletal: Normal range of motion. He exhibits no edema and no tenderness.  Lymphadenopathy:    He has no cervical adenopathy.  Neurological: He is alert and oriented to person, place, and time. He has normal reflexes. No cranial nerve deficit. He exhibits normal muscle tone.  Coordination normal.  Skin: Skin is warm and dry. No rash noted. He is not diaphoretic. No erythema. No pallor.  Psychiatric: He has a normal mood and affect. His behavior is normal. Judgment and thought content normal.          Assessment & Plan:  Well exam. Get fasting labs

## 2014-04-02 DIAGNOSIS — Z23 Encounter for immunization: Secondary | ICD-10-CM | POA: Diagnosis not present

## 2014-04-03 DIAGNOSIS — Z23 Encounter for immunization: Secondary | ICD-10-CM | POA: Diagnosis not present

## 2014-05-11 ENCOUNTER — Encounter: Payer: Self-pay | Admitting: Family Medicine

## 2014-05-12 ENCOUNTER — Other Ambulatory Visit: Payer: Self-pay | Admitting: Cardiovascular Disease

## 2014-05-12 ENCOUNTER — Other Ambulatory Visit: Payer: Self-pay | Admitting: Family Medicine

## 2014-05-14 ENCOUNTER — Other Ambulatory Visit: Payer: Self-pay

## 2014-05-15 ENCOUNTER — Other Ambulatory Visit: Payer: Self-pay | Admitting: Cardiovascular Disease

## 2014-05-15 ENCOUNTER — Encounter: Payer: Self-pay | Admitting: Cardiovascular Disease

## 2014-05-15 ENCOUNTER — Ambulatory Visit (INDEPENDENT_AMBULATORY_CARE_PROVIDER_SITE_OTHER): Payer: Medicare Other | Admitting: Cardiovascular Disease

## 2014-05-15 VITALS — BP 118/68 | HR 52 | Ht 69.5 in | Wt 187.8 lb

## 2014-05-15 DIAGNOSIS — I25810 Atherosclerosis of coronary artery bypass graft(s) without angina pectoris: Secondary | ICD-10-CM

## 2014-05-15 DIAGNOSIS — I10 Essential (primary) hypertension: Secondary | ICD-10-CM

## 2014-05-15 DIAGNOSIS — E785 Hyperlipidemia, unspecified: Secondary | ICD-10-CM | POA: Diagnosis not present

## 2014-05-15 DIAGNOSIS — I2510 Atherosclerotic heart disease of native coronary artery without angina pectoris: Secondary | ICD-10-CM

## 2014-05-15 MED ORDER — ROSUVASTATIN CALCIUM 10 MG PO TABS: ORAL_TABLET | ORAL | Status: DC

## 2014-05-15 MED ORDER — METOPROLOL TARTRATE 50 MG PO TABS: ORAL_TABLET | ORAL | Status: DC

## 2014-05-15 NOTE — Progress Notes (Signed)
   Background: The patient is followed for coronary disease with previous urgent coronary bypass surgery in 2007. He was treated in the setting of acute coronary syndrome and critical left main disease.  HPI:  68 year-old male presenting for follow-up evaluation. He was last seen one year ago. The patient is doing well. He continues to exercise regularly without exertional symptoms. He specifically denies chest pain, chest pressure, shortness of breath, edema, orthopnea, or PND. He's had no heart palpitations. His medications are unchanged.  Studies:  Lipid Panel     Component Value Date/Time   CHOL 147 03/30/2014 1053   TRIG 76.0 03/30/2014 1053   HDL 37.20* 03/30/2014 1053   CHOLHDL 4 03/30/2014 1053   VLDL 15.2 03/30/2014 1053   LDLCALC 95 03/30/2014 1053     Outpatient Encounter Prescriptions as of 05/15/2014  Medication Sig  . allopurinol (ZYLOPRIM) 300 MG tablet Take 1 tablet by mouth  daily  . aspirin 81 MG tablet Take 81 mg by mouth daily.    . CRESTOR 10 MG tablet Take 1 tablet by mouth  daily  . indomethacin (INDOCIN) 50 MG capsule Take by mouth. 1 every 6 hours as needed for gout   . metoprolol (LOPRESSOR) 50 MG tablet Take 1 tablet by mouth two  times daily  . Multiple Vitamins-Minerals (CENTRUM PO) Take by mouth.    Marland Kitchen NAPROXEN DR 500 MG EC tablet Take 1 tablet (500 mg  total) by mouth 2 (two)  times daily as needed for  (pain).  . tadalafil (CIALIS) 20 MG tablet Take 20 mg by mouth daily as needed.      No Known Allergies  Past Medical History  Diagnosis Date  . CAD (coronary artery disease)     post CABG.  RBB. no symptomatic concerns, sees Dr. [name]   . DJD (degenerative joint disease)     neck  . Gout   . Hyperlipidemia   . Diabetes mellitus   . Hypertension   . ED (erectile dysfunction)   . Myocardial infarct 07/03/06  . Vitiligo     sees Dr. [name]     family history includes Alcohol abuse in his other; Cancer in his other; Heart disease  in his other; Hypertension in his other; Stroke in his other. There is no history of Colon cancer, Colon polyps, Rectal cancer, or Stomach cancer.   ROS: Negative except as per HPI  BP 118/68 mmHg  Pulse 52  Ht 5' 9.5" (1.765 m)  Wt 187 lb 12.8 oz (85.186 kg)  BMI 27.35 kg/m2  PHYSICAL EXAM: Pt is alert and oriented, NAD HEENT: normal Neck: JVP - normal, carotids 2+= without bruits Lungs: CTA bilaterally CV: RRR without murmur or gallop Abd: soft, NT, Positive BS, no hepatomegaly Ext: no C/C/E, distal pulses intact and equal Skin: warm/dry no rash  EKG:  Sinus Bradycardia 52 bpm, RBBB, no change from previous.  ASSESSMENT AND PLAN: 1. Coronary artery disease, native vessel, without symptoms of angina. The patient is now 8 years out from CABG. He is completely asymptomatic. Will continue his current medical program. This includes aspirin, a statin drug, and a beta blocker.  2.hyperlipidemia. Lipids reviewed. LDL cholesterol has increased slightly from previous. HDL is in the same range. He is watching his diet and continues with regular exercise. I will keep him on Crestor 10 mg daily, and follow a lipid panel again next year.  [name], MD 05/15/2014 8:15 AM

## 2014-05-15 NOTE — Patient Instructions (Addendum)
Your physician wants you to follow-up in: 1 YEAR with Dr Cooper.  You will receive a reminder letter in the mail two months in advance. If you don't receive a letter, please call our office to schedule the follow-up appointment.  Your physician recommends that you continue on your current medications as directed. Please refer to the Current Medication list given to you today.  

## 2014-10-10 DIAGNOSIS — J018 Other acute sinusitis: Secondary | ICD-10-CM | POA: Diagnosis not present

## 2014-10-10 DIAGNOSIS — H1030 Unspecified acute conjunctivitis, unspecified eye: Secondary | ICD-10-CM | POA: Diagnosis not present

## 2015-03-15 ENCOUNTER — Telehealth: Payer: Self-pay

## 2015-03-15 NOTE — Telephone Encounter (Signed)
I spoke with the pt and made him aware that he is okay to take generic Crestor.

## 2015-03-15 NOTE — Telephone Encounter (Signed)
Patient called in requesting a call back from nurse about Crestor. He stated his insurance and pharmacy will only give him generic. Patient stated he is unsure if it ok to take generic because Dr. Burt Knack told him to only take Brand name. Call back number [phone] or [phone]

## 2015-03-19 ENCOUNTER — Encounter: Payer: Self-pay | Admitting: Gastroenterology

## 2015-03-22 DIAGNOSIS — Z23 Encounter for immunization: Secondary | ICD-10-CM | POA: Diagnosis not present

## 2015-04-02 ENCOUNTER — Encounter: Payer: Self-pay | Admitting: Family Medicine

## 2015-04-02 ENCOUNTER — Ambulatory Visit (INDEPENDENT_AMBULATORY_CARE_PROVIDER_SITE_OTHER): Payer: Medicare Other | Admitting: Family Medicine

## 2015-04-02 VITALS — BP 138/79 | HR 52 | Temp 97.7°F | Ht 69.5 in | Wt 199.0 lb

## 2015-04-02 DIAGNOSIS — N401 Enlarged prostate with lower urinary tract symptoms: Secondary | ICD-10-CM | POA: Diagnosis not present

## 2015-04-02 DIAGNOSIS — E119 Type 2 diabetes mellitus without complications: Secondary | ICD-10-CM | POA: Diagnosis not present

## 2015-04-02 DIAGNOSIS — M109 Gout, unspecified: Secondary | ICD-10-CM

## 2015-04-02 DIAGNOSIS — I10 Essential (primary) hypertension: Secondary | ICD-10-CM

## 2015-04-02 DIAGNOSIS — E785 Hyperlipidemia, unspecified: Secondary | ICD-10-CM | POA: Diagnosis not present

## 2015-04-02 DIAGNOSIS — N138 Other obstructive and reflux uropathy: Secondary | ICD-10-CM

## 2015-04-02 LAB — POCT URINALYSIS DIPSTICK
Bilirubin, UA: NEGATIVE
GLUCOSE UA: NEGATIVE
Ketones, UA: NEGATIVE
Nitrite, UA: NEGATIVE
Protein, UA: NEGATIVE
RBC UA: NEGATIVE
SPEC GRAV UA: 1.01
UROBILINOGEN UA: 0.2
pH, UA: 6

## 2015-04-02 LAB — BASIC METABOLIC PANEL
BUN: 23 mg/dL (ref 6–23)
CO2: 30 meq/L (ref 19–32)
Calcium: 9.8 mg/dL (ref 8.4–10.5)
Chloride: 106 mEq/L (ref 96–112)
Creatinine, Ser: 1.16 mg/dL (ref 0.40–1.50)
GFR: 66.21 mL/min (ref >60.00)
Glucose, Bld: 101 mg/dL — ABNORMAL HIGH (ref 70–99)
Potassium: 5.3 mEq/L — ABNORMAL HIGH (ref 3.5–5.1)
Sodium: 142 mEq/L (ref 135–145)

## 2015-04-02 LAB — HEPATIC FUNCTION PANEL
ALBUMIN: 4.5 g/dL (ref 3.5–5.2)
ALT: 16 U/L (ref 0–53)
AST: 20 U/L (ref 0–37)
Alkaline Phosphatase: 47 U/L (ref 39–117)
Bilirubin, Direct: 0.2 mg/dL (ref 0.0–0.3)
Total Bilirubin: 0.8 mg/dL (ref 0.2–1.2)
Total Protein: 6.8 g/dL (ref 6.0–8.3)

## 2015-04-02 LAB — LIPID PANEL
Cholesterol: 122 mg/dL (ref 0–200)
HDL: 41.9 mg/dL (ref >39.00)
LDL CALC: 63 mg/dL (ref 0–99)
NonHDL: 79.95
TRIGLYCERIDES: 83 mg/dL (ref 0.0–149.0)
Total CHOL/HDL Ratio: 3
VLDL: 16.6 mg/dL (ref 0.0–40.0)

## 2015-04-02 LAB — CBC WITH DIFFERENTIAL/PLATELET
Basophils Absolute: 0 10*3/uL (ref 0.0–0.1)
Basophils Relative: 0.6 % (ref 0.0–3.0)
EOS PCT: 4.3 % (ref 0.0–5.0)
Eosinophils Absolute: 0.2 10*3/uL (ref 0.0–0.7)
HCT: 46.6 % (ref 39.0–52.0)
Hemoglobin: 15.7 g/dL (ref 13.0–17.0)
LYMPHS ABS: 1.3 10*3/uL (ref 0.7–4.0)
Lymphocytes Relative: 23.8 % (ref 12.0–46.0)
MCHC: 33.6 g/dL (ref 30.0–36.0)
MCV: 87.1 fl (ref 78.0–100.0)
MONO ABS: 0.6 10*3/uL (ref 0.1–1.0)
Monocytes Relative: 10.4 % (ref 3.0–12.0)
NEUTROS PCT: 60.9 % (ref 43.0–77.0)
Neutro Abs: 3.4 10*3/uL (ref 1.4–7.7)
Platelets: 195 10*3/uL (ref 150.0–400.0)
RBC: 5.36 Mil/uL (ref 4.22–5.81)
RDW: 15 % (ref 11.5–15.5)
WBC: 5.6 10*3/uL (ref 4.0–10.5)

## 2015-04-02 LAB — MICROALBUMIN / CREATININE URINE RATIO
Creatinine,U: 58.7 mg/dL
Microalb Creat Ratio: 1.2 mg/g (ref 0.0–30.0)
Microalb, Ur: 0.7 mg/dL (ref 0.0–1.9)

## 2015-04-02 LAB — HEMOGLOBIN A1C: HEMOGLOBIN A1C: 5.6 % (ref 4.6–6.5)

## 2015-04-02 LAB — URIC ACID: Uric Acid, Serum: 5.2 mg/dL (ref 4.0–7.8)

## 2015-04-02 LAB — PSA: PSA: 0.83 ng/mL (ref 0.10–4.00)

## 2015-04-02 LAB — TSH: TSH: 3.01 u[IU]/mL (ref 0.35–4.50)

## 2015-04-02 NOTE — Progress Notes (Signed)
   Subjective:    Patient ID: Kevin Mcbride, male    DOB: [birth year], 69 y.o.   MRN: [id number]  HPI 69 yr old male to follow up on several problems. His BP has been stable at home. He does not check his glucoses. He watches his diet closely and exercises. He feels great. He has not had a gout attack for several years.    Review of Systems  Constitutional: Negative.   HENT: Negative.   Eyes: Negative.   Respiratory: Negative.   Cardiovascular: Negative.   Gastrointestinal: Negative.   Genitourinary: Negative.   Musculoskeletal: Negative.   Skin: Negative.   Neurological: Negative.   Psychiatric/Behavioral: Negative.        Objective:   Physical Exam  Constitutional: He is oriented to person, place, and time. He appears well-developed and well-nourished. No distress.  HENT:  Head: Normocephalic and atraumatic.  Right Ear: External ear normal.  Left Ear: External ear normal.  Nose: Nose normal.  Mouth/Throat: Oropharynx is clear and moist. No oropharyngeal exudate.  Eyes: Conjunctivae and EOM are normal. Pupils are equal, round, and reactive to light. Right eye exhibits no discharge. Left eye exhibits no discharge. No scleral icterus.  Neck: Neck supple. No JVD present. No tracheal deviation present. No thyromegaly present.  Cardiovascular: Normal rate, regular rhythm, normal heart sounds and intact distal pulses.  Exam reveals no gallop and no friction rub.   No murmur heard. Pulmonary/Chest: Effort normal and breath sounds normal. No respiratory distress. He has no wheezes. He has no rales. He exhibits no tenderness.  Abdominal: Soft. Bowel sounds are normal. He exhibits no distension and no mass. There is no tenderness. There is no rebound and no guarding.  Genitourinary: Rectum normal, prostate normal and penis normal. Guaiac negative stool. No penile tenderness.  Musculoskeletal: Normal range of motion. He exhibits no edema or tenderness.  Lymphadenopathy:    He has no  cervical adenopathy.  Neurological: He is alert and oriented to person, place, and time. He has normal reflexes. No cranial nerve deficit. He exhibits normal muscle tone. Coordination normal.  Skin: Skin is warm and dry. No rash noted. He is not diaphoretic. No erythema. No pallor.  Psychiatric: He has a normal mood and affect. His behavior is normal. Judgment and thought content normal.          Assessment & Plan:  His HTN is stable. His gout seems to be under control, we will check a uric acid level. Check his diabetes with an A1c. His CAD seems to be stable, and he will see Dr. Burt Knack again in November.

## 2015-04-27 ENCOUNTER — Other Ambulatory Visit: Payer: Self-pay | Admitting: Family Medicine

## 2015-05-31 ENCOUNTER — Encounter: Payer: Self-pay | Admitting: Cardiovascular Disease

## 2015-05-31 ENCOUNTER — Ambulatory Visit (INDEPENDENT_AMBULATORY_CARE_PROVIDER_SITE_OTHER): Payer: Medicare Other | Admitting: Cardiovascular Disease

## 2015-05-31 VITALS — BP 142/88 | HR 64 | Ht 70.0 in | Wt 203.4 lb

## 2015-05-31 DIAGNOSIS — I2510 Atherosclerotic heart disease of native coronary artery without angina pectoris: Secondary | ICD-10-CM | POA: Diagnosis not present

## 2015-05-31 DIAGNOSIS — I10 Essential (primary) hypertension: Secondary | ICD-10-CM | POA: Diagnosis not present

## 2015-05-31 DIAGNOSIS — E785 Hyperlipidemia, unspecified: Secondary | ICD-10-CM | POA: Diagnosis not present

## 2015-05-31 MED ORDER — METOPROLOL TARTRATE 50 MG PO TABS: ORAL_TABLET | ORAL | Status: DC

## 2015-05-31 MED ORDER — ROSUVASTATIN CALCIUM 10 MG PO TABS: ORAL_TABLET | ORAL | Status: DC

## 2015-05-31 NOTE — Patient Instructions (Signed)

## 2015-05-31 NOTE — Progress Notes (Signed)
Cardiology Office Note Date:  05/31/2015   ID:  Kevin Mcbride, DOB July 11, 1945, MRN [id number]  PCP:  [name], MD  Cardiologist:  [name], MD    Chief Complaint  Patient presents with  . Coronary Artery Disease    History of Present Illness: Kevin Mcbride is a 69 y.o. male who presents for  Follow-up of CAD. He underwent urgent CABG in 2007 in the setting of acute coronary syndrome. Cardiac catheterization at that time demonstrated critical left main disease. Over the last 10 years of follow-up he's had no recurrent cardiac events and no cardiopulmonary limitation with exercise.   the done well over the last year. He continues with regular exercise. He has no symptoms with exertion. He has gained some weight since last year, but otherwise no interval complaints or changes in his health.Today, he denies symptoms of palpitations, chest pain, shortness of breath, orthopnea, PND, lower extremity edema, dizziness, or syncope.  Past Medical History  Diagnosis Date  . CAD (coronary artery disease)     post CABG.  RBB. no symptomatic concerns, sees Dr. [name]   . DJD (degenerative joint disease)     neck  . Gout   . Hyperlipidemia   . Diabetes mellitus   . Hypertension   . ED (erectile dysfunction)   . Myocardial infarct ([place]) 07/03/06  . Vitiligo     sees Dr. [name]     Past Surgical History  Procedure Laterality Date  . Cardiac bypass  2009    triple  . Colonoscopy  03-14-11    per Dr. Fuller Plan, polyps, repeat in 5 yrs     Current Outpatient Prescriptions  Medication Sig Dispense Refill  . allopurinol (ZYLOPRIM) 300 MG tablet Take 1 tablet by mouth  daily 90 tablet 3  . aspirin 81 MG tablet Take 81 mg by mouth daily.      . indomethacin (INDOCIN) 50 MG capsule Take 50 mg by mouth every 6 (six) hours as needed (GOUT).    . metoprolol (LOPRESSOR) 50 MG tablet Take 1 tablet by mouth two  times daily 180 tablet 3  . Multiple Vitamins-Minerals  (CENTRUM PO) Take 1 tablet by mouth daily.     Marland Kitchen NAPROXEN DR 500 MG EC tablet Take 1 tablet (500 mg  total) by mouth 2 (two)  times daily as needed for  (pain). 180 tablet 3  . rosuvastatin (CRESTOR) 10 MG tablet Take 1 tablet by mouth  daily 90 tablet 3  . tadalafil (CIALIS) 20 MG tablet Take 20 mg by mouth daily as needed for erectile dysfunction.      No current facility-administered medications for this visit.    Allergies:   Review of patient's allergies indicates no known allergies.   Social History:  The patient  reports that he has never smoked. He has never used smokeless tobacco. He reports that he drinks about 1.2 oz of alcohol per week. He reports that he does not use illicit drugs.   Family History:  The patient's family history includes Alcohol abuse in his other; Cancer in his other; Heart disease in his other; Hypertension in his other; Stroke in his other. There is no history of Colon cancer, Colon polyps, Rectal cancer, or Stomach cancer.    ROS:  Please see the history of present illness. All other systems are reviewed and negative.    PHYSICAL EXAM: VS:  BP 142/88 mmHg  Pulse 64  Ht 5\' 10"  (1.778 m)  Wt  203 lb 6.4 oz (92.262 kg)  BMI 29.18 kg/m2 , BMI Body mass index is 29.18 kg/(m^2). GEN: Well nourished, well developed, in no acute distress HEENT: normal Neck: no JVD, no masses. No carotid bruits Cardiac: RRR without murmur or gallop                Respiratory:  clear to auscultation bilaterally, normal work of breathing GI: soft, nontender, nondistended, + BS MS: no deformity or atrophy Ext: no pretibial edema, pedal pulses 2+= bilaterally Skin: warm and dry, no rash Neuro:  Strength and sensation are intact Psych: euthymic mood, full affect  EKG:  EKG is ordered today. The ekg ordered today shows NSR 64 bpm, RBBB, left axis  Recent Labs: 04/02/2015: ALT 16; BUN 23; Creatinine, Ser 1.16; Hemoglobin 15.7; Platelets 195.0; Potassium 5.3*; Sodium 142; TSH  3.01   Lipid Panel     Component Value Date/Time   CHOL 122 04/02/2015 1107   TRIG 83.0 04/02/2015 1107   TRIG 67 06/08/2006 1049   HDL 41.90 04/02/2015 1107   CHOLHDL 3 04/02/2015 1107   CHOLHDL 4.1 CALC 06/08/2006 1049   VLDL 16.6 04/02/2015 1107   LDLCALC 63 04/02/2015 1107      Wt Readings from Last 3 Encounters:  05/31/15 203 lb 6.4 oz (92.262 kg)  04/02/15 199 lb (90.266 kg)  05/15/14 187 lb 12.8 oz (85.186 kg)    ASSESSMENT AND PLAN: 1.   CAD, native vessel, without symptoms of angina:  He continues to do well with regular exercise and no ischemic symptoms. We discussed lifestyle modification and weight loss. I will see him back in one year. No medication changes made today.  2. Hyperlipidemia: recent lipids are reviewed and are at goal with an LDL of 63, HDL 42, and total cholesterol 122.  He will continue on Crestor.  Current medicines are reviewed with the patient today.  The patient does not have concerns regarding medicines.  Labs/ tests ordered today include:  No orders of the defined types were placed in this encounter.   Disposition:   FU one year  Signed, [name], MD  05/31/2015 2:01 PM    [facility] [place], [place], [place]  [address] Phone: [phone]; Fax: [phone]

## 2015-06-08 ENCOUNTER — Encounter: Payer: Self-pay | Admitting: Cardiovascular Disease

## 2015-06-09 ENCOUNTER — Encounter: Payer: Self-pay | Admitting: Cardiology

## 2015-11-09 ENCOUNTER — Other Ambulatory Visit: Payer: Self-pay | Admitting: Family Medicine

## 2016-01-05 ENCOUNTER — Encounter: Payer: Self-pay | Admitting: Gastroenterology

## 2016-01-13 ENCOUNTER — Encounter: Payer: Self-pay | Admitting: Gastroenterology

## 2016-03-09 ENCOUNTER — Ambulatory Visit (AMBULATORY_SURGERY_CENTER): Payer: Self-pay | Admitting: *Deleted

## 2016-03-09 VITALS — Ht 70.0 in | Wt 197.0 lb

## 2016-03-09 DIAGNOSIS — Z86010 Personal history of colonic polyps: Secondary | ICD-10-CM

## 2016-03-09 MED ORDER — NA SULFATE-K SULFATE-MG SULF 17.5-3.13-1.6 GM/177ML PO SOLN: 1.0000 | Freq: Once | ORAL | 0 refills | Status: AC

## 2016-03-09 NOTE — Progress Notes (Signed)
Denies allergies to eggs or soy products. Denies complications with sedation or anesthesia. Denies O2 use. Denies use of diet or weight loss medications.  Emmi instructions given for colonoscopy.  

## 2016-03-20 DIAGNOSIS — Z23 Encounter for immunization: Secondary | ICD-10-CM | POA: Diagnosis not present

## 2016-03-23 ENCOUNTER — Encounter: Payer: Self-pay | Admitting: Gastroenterology

## 2016-03-23 ENCOUNTER — Ambulatory Visit (AMBULATORY_SURGERY_CENTER): Payer: Medicare Other | Admitting: Gastroenterology

## 2016-03-23 VITALS — BP 125/80 | HR 79 | Temp 97.5°F | Resp 12 | Ht 70.0 in | Wt 197.0 lb

## 2016-03-23 DIAGNOSIS — I10 Essential (primary) hypertension: Secondary | ICD-10-CM | POA: Diagnosis not present

## 2016-03-23 DIAGNOSIS — D123 Benign neoplasm of transverse colon: Secondary | ICD-10-CM

## 2016-03-23 DIAGNOSIS — E119 Type 2 diabetes mellitus without complications: Secondary | ICD-10-CM | POA: Diagnosis not present

## 2016-03-23 DIAGNOSIS — Z86010 Personal history of colonic polyps: Secondary | ICD-10-CM | POA: Diagnosis not present

## 2016-03-23 DIAGNOSIS — I252 Old myocardial infarction: Secondary | ICD-10-CM | POA: Diagnosis not present

## 2016-03-23 DIAGNOSIS — I2510 Atherosclerotic heart disease of native coronary artery without angina pectoris: Secondary | ICD-10-CM | POA: Diagnosis not present

## 2016-03-23 HISTORY — PX: COLONOSCOPY: SHX174

## 2016-03-23 MED ORDER — SODIUM CHLORIDE 0.9 % IV SOLN: 500.0000 mL | INTRAVENOUS | Status: AC

## 2016-03-23 NOTE — Progress Notes (Signed)
To recovery, report to [place], RN, VSS 

## 2016-03-23 NOTE — Patient Instructions (Signed)
YOU HAD AN ENDOSCOPIC PROCEDURE TODAY AT [facility]:   Refer to the procedure report that was given to you for any specific questions about what was found during the examination.  If the procedure report does not answer your questions, please call your gastroenterologist to clarify.  If you requested that your care partner not be given the details of your procedure findings, then the procedure report has been included in a sealed envelope for you to review at your convenience later.  YOU SHOULD EXPECT: Some feelings of bloating in the abdomen. Passage of more gas than usual.  Walking can help get rid of the air that was put into your GI tract during the procedure and reduce the bloating. If you had a lower endoscopy (such as a colonoscopy or flexible sigmoidoscopy) you may notice spotting of blood in your stool or on the toilet paper. If you underwent a bowel prep for your procedure, you may not have a normal bowel movement for a few days.  Please Note:  You might notice some irritation and congestion in your nose or some drainage.  This is from the oxygen used during your procedure.  There is no need for concern and it should clear up in a day or so.  SYMPTOMS TO REPORT IMMEDIATELY:   Following lower endoscopy (colonoscopy or flexible sigmoidoscopy):  Excessive amounts of blood in the stool  Significant tenderness or worsening of abdominal pains  Swelling of the abdomen that is new, acute  Fever of 100F or higher  For urgent or emergent issues, a gastroenterologist can be reached at any hour by calling [phone].   DIET:  We do recommend a small meal at first, but then you may proceed to your regular diet.  Drink plenty of fluids but you should avoid alcoholic beverages for 24 hours.  ACTIVITY:  You should plan to take it easy for the rest of today and you should NOT DRIVE or use heavy machinery until tomorrow (because of the sedation medicines used during the test).     FOLLOW UP: Our staff will call the number listed on your records the next business day following your procedure to check on you and address any questions or concerns that you may have regarding the information given to you following your procedure. If we do not reach you, we will leave a message.  However, if you are feeling well and you are not experiencing any problems, there is no need to return our call.  We will assume that you have returned to your regular daily activities without incident.  If any biopsies were taken you will be contacted by phone or by letter within the next 1-3 weeks.  Please call us at [phone] if you have not heard about the biopsies in 3 weeks.    SIGNATURES/CONFIDENTIALITY: You and/or your care partner have signed paperwork which will be entered into your electronic medical record.  These signatures attest to the fact that that the information above on your After Visit Summary has been reviewed and is understood.  Full responsibility of the confidentiality of this discharge information lies with you and/or your care-partner.  Await pathology- next colonoscopy- 5 years  Please read over handouts about polyps, hemorrhoids, and high fiber diets  Please continue your normal medications

## 2016-03-23 NOTE — Progress Notes (Signed)
Called to room to assist during endoscopic procedure.  Patient ID and intended procedure confirmed with present staff. Received instructions for my participation in the procedure from the performing physician.  

## 2016-03-23 NOTE — Op Note (Signed)
[facility] Patient Name: Kevin Mcbride Procedure Date: 03/23/2016 10:48 AM MRN: [id number] Endoscopist: Ladene Artist , MD Age: 70 Referring MD:  Date of Birth: [birth year] Gender: Male Account #: [id number] Procedure:                Colonoscopy Indications:              Surveillance: Personal history of adenomatous                            polyps on last colonoscopy > 5 years ago Medicines:                Monitored Anesthesia Care Procedure:                Pre-Anesthesia Assessment:                           - Prior to the procedure, a History and Physical                            was performed, and patient medications and                            allergies were reviewed. The patient's tolerance of                            previous anesthesia was also reviewed. The risks                            and benefits of the procedure and the sedation                            options and risks were discussed with the patient.                            All questions were answered, and informed consent                            was obtained. Prior Anticoagulants: The patient has                            taken no previous anticoagulant or antiplatelet                            agents. ASA Grade Assessment: II - A patient with                            mild systemic disease. After reviewing the risks                            and benefits, the patient was deemed in                            satisfactory condition to undergo the procedure.  After obtaining informed consent, the colonoscope                            was passed under direct vision. Throughout the                            procedure, the patient's blood pressure, pulse, and                            oxygen saturations were monitored continuously. The                            Model PCF-[id number]) scope was introduced                            through the anus and  advanced to the the cecum,                            identified by appendiceal orifice and ileocecal                            valve. The ileocecal valve, appendiceal orifice,                            and rectum were photographed. The quality of the                            bowel preparation was adequate. The colonoscopy was                            performed without difficulty. The patient tolerated                            the procedure well. Scope In: 11:00:05 AM Scope Out: 11:12:11 AM Scope Withdrawal Time: 0 hours 10 minutes 1 second  Total Procedure Duration: 0 hours 12 minutes 6 seconds  Findings:                 The perianal and digital rectal examinations were                            normal.                           A 6 mm polyp was found in the transverse colon. The                            polyp was sessile. The polyp was removed with a                            cold snare. Resection and retrieval were complete.                           Internal hemorrhoids were found during  retroflexion. The hemorrhoids were small and Grade                            I (internal hemorrhoids that do not prolapse).                           Multiple medium-mouthed diverticula were found in                            the sigmoid colon and descending colon. There was                            narrowing of the colon in association with the                            diverticular opening. There was evidence of                            diverticular spasm. There was no evidence of                            diverticular bleeding.                           The exam was otherwise without abnormality on                            direct and retroflexion views. Complications:            No immediate complications. Estimated blood loss:                            None. Estimated Blood Loss:     Estimated blood loss: none. Impression:               - One 6  mm polyp in the transverse colon, removed                            with a cold snare. Resected and retrieved.                           - Internal hemorrhoids.                           - Moderate diverticulosis in the sigmoid colon and                            in the descending colon.                           - The examination was otherwise normal on direct                            and retroflexion views. Recommendation:           - Repeat colonoscopy in 5 years for surveillance  with a more extensive bowel prep.                           - Patient has a contact number available for                            emergencies. The signs and symptoms of potential                            delayed complications were discussed with the                            patient. Return to normal activities tomorrow.                            Written discharge instructions were provided to the                            patient.                           - High fiber diet.                           - Continue present medications.                           - Await pathology results. Ladene Artist, MD 03/23/2016 11:20:15 AM This report has been signed electronically.

## 2016-03-24 ENCOUNTER — Telehealth: Payer: Self-pay

## 2016-03-24 NOTE — Telephone Encounter (Signed)
  Follow up Call-  Call back number 03/23/2016  Post procedure Call Back phone  # [phone]  Phone comments will be in bible study  Permission to leave phone message Yes  Some recent data might be hidden     Patient questions:  Do you have a fever, pain , or abdominal swelling? No. Pain Score  0 *  Have you tolerated food without any problems? Yes.    Have you been able to return to your normal activities? Yes.    Do you have any questions about your discharge instructions: Diet   No. Medications  No. Follow up visit  No.  Do you have questions or concerns about your Care? No.  Actions: * If pain score is 4 or above: No action needed, pain <4.  No problems per the pt. maw

## 2016-03-30 ENCOUNTER — Encounter: Payer: Self-pay | Admitting: Gastroenterology

## 2016-04-04 ENCOUNTER — Ambulatory Visit (INDEPENDENT_AMBULATORY_CARE_PROVIDER_SITE_OTHER): Payer: Medicare Other | Admitting: Family Medicine

## 2016-04-04 ENCOUNTER — Encounter: Payer: Self-pay | Admitting: Family Medicine

## 2016-04-04 VITALS — BP 138/82 | HR 51 | Temp 97.9°F | Ht 70.0 in | Wt 195.0 lb

## 2016-04-04 DIAGNOSIS — E119 Type 2 diabetes mellitus without complications: Secondary | ICD-10-CM | POA: Diagnosis not present

## 2016-04-04 DIAGNOSIS — E785 Hyperlipidemia, unspecified: Secondary | ICD-10-CM

## 2016-04-04 DIAGNOSIS — M1A9XX0 Chronic gout, unspecified, without tophus (tophi): Secondary | ICD-10-CM | POA: Diagnosis not present

## 2016-04-04 DIAGNOSIS — I2510 Atherosclerotic heart disease of native coronary artery without angina pectoris: Secondary | ICD-10-CM

## 2016-04-04 DIAGNOSIS — N138 Other obstructive and reflux uropathy: Secondary | ICD-10-CM

## 2016-04-04 DIAGNOSIS — I10 Essential (primary) hypertension: Secondary | ICD-10-CM

## 2016-04-04 DIAGNOSIS — N401 Enlarged prostate with lower urinary tract symptoms: Secondary | ICD-10-CM | POA: Diagnosis not present

## 2016-04-04 LAB — LIPID PANEL
CHOL/HDL RATIO: 3
CHOLESTEROL: 119 mg/dL (ref 0–200)
HDL: 38.8 mg/dL — AB (ref >39.00)
LDL Cholesterol: 68 mg/dL (ref 0–99)
NonHDL: 79.77
TRIGLYCERIDES: 58 mg/dL (ref 0.0–149.0)
VLDL: 11.6 mg/dL (ref 0.0–40.0)

## 2016-04-04 LAB — BASIC METABOLIC PANEL
BUN: 19 mg/dL (ref 6–23)
CHLORIDE: 103 meq/L (ref 96–112)
CO2: 31 meq/L (ref 19–32)
CREATININE: 1.25 mg/dL (ref 0.40–1.50)
Calcium: 9.9 mg/dL (ref 8.4–10.5)
GFR: 60.57 mL/min (ref >60.00)
GLUCOSE: 102 mg/dL — AB (ref 70–99)
POTASSIUM: 5.5 meq/L — AB (ref 3.5–5.1)
Sodium: 140 mEq/L (ref 135–145)

## 2016-04-04 LAB — POC URINALSYSI DIPSTICK (AUTOMATED)
BILIRUBIN UA: NEGATIVE
GLUCOSE UA: NEGATIVE
KETONES UA: NEGATIVE
Nitrite, UA: NEGATIVE
PH UA: 5.5
Protein, UA: NEGATIVE
Spec Grav, UA: 1.01
Urobilinogen, UA: 0.2

## 2016-04-04 LAB — CBC WITH DIFFERENTIAL/PLATELET
BASOS ABS: 0 10*3/uL (ref 0.0–0.1)
BASOS PCT: 0.5 % (ref 0.0–3.0)
EOS ABS: 0.3 10*3/uL (ref 0.0–0.7)
Eosinophils Relative: 5.5 % — ABNORMAL HIGH (ref 0.0–5.0)
HEMATOCRIT: 46.9 % (ref 39.0–52.0)
Hemoglobin: 16 g/dL (ref 13.0–17.0)
LYMPHS ABS: 1.1 10*3/uL (ref 0.7–4.0)
Lymphocytes Relative: 19.5 % (ref 12.0–46.0)
MCHC: 34.2 g/dL (ref 30.0–36.0)
MCV: 87.3 fl (ref 78.0–100.0)
MONOS PCT: 9.1 % (ref 3.0–12.0)
Monocytes Absolute: 0.5 10*3/uL (ref 0.1–1.0)
NEUTROS ABS: 3.6 10*3/uL (ref 1.4–7.7)
NEUTROS PCT: 65.4 % (ref 43.0–77.0)
PLATELETS: 200 10*3/uL (ref 150.0–400.0)
RBC: 5.37 Mil/uL (ref 4.22–5.81)
RDW: 15.2 % (ref 11.5–15.5)
WBC: 5.4 10*3/uL (ref 4.0–10.5)

## 2016-04-04 LAB — TSH: TSH: 3.3 u[IU]/mL (ref 0.35–4.50)

## 2016-04-04 LAB — URIC ACID: Uric Acid, Serum: 5.9 mg/dL (ref 4.0–7.8)

## 2016-04-04 LAB — HEPATIC FUNCTION PANEL
ALBUMIN: 4.5 g/dL (ref 3.5–5.2)
ALK PHOS: 45 U/L (ref 39–117)
ALT: 16 U/L (ref 0–53)
AST: 21 U/L (ref 0–37)
Bilirubin, Direct: 0.2 mg/dL (ref 0.0–0.3)
Total Bilirubin: 0.7 mg/dL (ref 0.2–1.2)
Total Protein: 7.1 g/dL (ref 6.0–8.3)

## 2016-04-04 LAB — PSA: PSA: 1.24 ng/mL (ref 0.10–4.00)

## 2016-04-04 LAB — HEMOGLOBIN A1C: HEMOGLOBIN A1C: 5.4 % (ref 4.6–6.5)

## 2016-04-04 MED ORDER — ALLOPURINOL 300 MG PO TABS: 300.0000 mg | ORAL_TABLET | Freq: Every day | ORAL | 3 refills | Status: DC

## 2016-04-04 MED ORDER — METOPROLOL TARTRATE 50 MG PO TABS: ORAL_TABLET | ORAL | 3 refills | Status: DC

## 2016-04-04 MED ORDER — ROSUVASTATIN CALCIUM 10 MG PO TABS: ORAL_TABLET | ORAL | 3 refills | Status: DC

## 2016-04-04 NOTE — Progress Notes (Signed)
Pre visit review using our clinic review tool, if applicable. No additional management support is needed unless otherwise documented below in the visit note. 

## 2016-04-04 NOTE — Progress Notes (Signed)
   Subjective:    Patient ID: Kevin Mcbride, male    DOB: [birth year], 70 y.o.   MRN: [id number]  HPI 70 yr old male to follow up on issues. He feels great and has no concerns. He is active playing golf and he enjoys cruises to the [place] and to [place]. He sees Cardiology regularly.    Review of Systems  Constitutional: Negative.   HENT: Negative.   Eyes: Negative.   Respiratory: Negative.   Cardiovascular: Negative.   Gastrointestinal: Negative.   Genitourinary: Negative.   Musculoskeletal: Negative.   Skin: Negative.   Neurological: Negative.   Psychiatric/Behavioral: Negative.        Objective:   Physical Exam  Constitutional: He is oriented to person, place, and time. He appears well-developed and well-nourished. No distress.  HENT:  Head: Normocephalic and atraumatic.  Right Ear: External ear normal.  Left Ear: External ear normal.  Nose: Nose normal.  Mouth/Throat: Oropharynx is clear and moist. No oropharyngeal exudate.  Eyes: Conjunctivae and EOM are normal. Pupils are equal, round, and reactive to light. Right eye exhibits no discharge. Left eye exhibits no discharge. No scleral icterus.  Neck: Neck supple. No JVD present. No tracheal deviation present. No thyromegaly present.  Cardiovascular: Normal rate, regular rhythm, normal heart sounds and intact distal pulses.  Exam reveals no gallop and no friction rub.   No murmur heard. Pulmonary/Chest: Effort normal and breath sounds normal. No respiratory distress. He has no wheezes. He has no rales. He exhibits no tenderness.  Abdominal: Soft. Bowel sounds are normal. He exhibits no distension and no mass. There is no tenderness. There is no rebound and no guarding.  Genitourinary: Rectum normal, prostate normal and penis normal. Rectal exam shows guaiac negative stool. No penile tenderness.  Musculoskeletal: Normal range of motion. He exhibits no edema or tenderness.  Lymphadenopathy:    He has no cervical  adenopathy.  Neurological: He is alert and oriented to person, place, and time. He has normal reflexes. No cranial nerve deficit. He exhibits normal muscle tone. Coordination normal.  Skin: Skin is warm and dry. No rash noted. He is not diaphoretic. No erythema. No pallor.  Psychiatric: He has a normal mood and affect. His behavior is normal. Judgment and thought content normal.          Assessment & Plan:  His HTN and gout are stable. We will get fasting labs to check his A1c and uric acid, among other things. His diabetes and lipids will be followed closely.  [name], MD

## 2016-06-01 ENCOUNTER — Ambulatory Visit (INDEPENDENT_AMBULATORY_CARE_PROVIDER_SITE_OTHER): Payer: Medicare Other | Admitting: Cardiovascular Disease

## 2016-06-01 ENCOUNTER — Encounter: Payer: Self-pay | Admitting: Cardiovascular Disease

## 2016-06-01 VITALS — BP 150/80 | HR 53 | Ht 70.0 in | Wt 194.2 lb

## 2016-06-01 DIAGNOSIS — I2510 Atherosclerotic heart disease of native coronary artery without angina pectoris: Secondary | ICD-10-CM

## 2016-06-01 DIAGNOSIS — E785 Hyperlipidemia, unspecified: Secondary | ICD-10-CM | POA: Diagnosis not present

## 2016-06-01 DIAGNOSIS — I10 Essential (primary) hypertension: Secondary | ICD-10-CM | POA: Diagnosis not present

## 2016-06-01 NOTE — Patient Instructions (Signed)

## 2016-06-01 NOTE — Progress Notes (Signed)
Cardiology Office Note Date:  06/01/2016   ID:  Kevin Mcbride, DOB 08-Apr-1946, MRN [id number]  PCP:  [name], MD  Cardiologist:  [name], MD    Chief Complaint  Patient presents with  . Coronary Artery Disease   History of Present Illness: Kevin Mcbride is a 70 y.o. male who presents for  Follow-up of CAD. He underwent urgent CABG in 2007 in the setting of acute coronary syndrome. He underwent 3 vessel CABG in January 2018 with LIMA-LAD, SVG-diag 1, and SVG-OM1). The RCA was nondominant. Cardiac catheterization at that time demonstrated critical left main disease. Over the last 10 years of follow-up he's had no recurrent cardiac events and no cardiopulmonary limitation with exercise.  The patient is doing well. He has no cardiac related complaints today. He specifically denies chest pain, chest pressure, shortness of breath, or heart palpitations. He continues to exercise regularly without exertional symptoms. His medications are unchanged.  Past Medical History:  Diagnosis Date  . CAD (coronary artery disease)    post CABG.  RBB. no symptomatic concerns, sees Dr. [name]   . Diabetes mellitus    one time elevated blood sugar  . DJD (degenerative joint disease)    neck  . ED (erectile dysfunction)   . Gout   . Hyperlipidemia   . Hypertension   . Myocardial infarct 07/03/06  . Vitiligo    sees Dr. [name]     Past Surgical History:  Procedure Laterality Date  . cardiac bypass  2009   triple  . COLONOSCOPY  03/23/2016   per Dr. Fuller Plan,  adenomatous polyp, repeat in 5 yrs     Current Outpatient Prescriptions  Medication Sig Dispense Refill  . allopurinol (ZYLOPRIM) 300 MG tablet Take 1 tablet (300 mg total) by mouth daily. 90 tablet 3  . aspirin 81 MG tablet Take 81 mg by mouth daily.      . Coenzyme Q10 (CO Q 10 PO) Take 1 capsule by mouth daily.     . metoprolol (LOPRESSOR) 50 MG tablet Take 1 tablet by mouth two  times daily 180 tablet  3  . Multiple Vitamins-Minerals (CENTRUM PO) Take 1 tablet by mouth daily.     Marland Kitchen NAPROXEN DR 500 MG EC tablet Take 1 tablet (500 mg  total) by mouth 2 (two)  times daily as needed for  (pain). 180 tablet 1  . rosuvastatin (CRESTOR) 10 MG tablet Take 1 tablet by mouth  daily 90 tablet 3  . tadalafil (CIALIS) 20 MG tablet Take 20 mg by mouth daily as needed for erectile dysfunction.      Current Facility-Administered Medications  Medication Dose Route Frequency Provider Last Rate Last Dose  . 0.9 %  sodium chloride infusion  500 mL Intravenous Continuous Ladene Artist, MD        Allergies:   Patient has no known allergies.   Social History:  The patient  reports that he has never smoked. He has never used smokeless tobacco. He reports that he drinks about 1.2 oz of alcohol per week . He reports that he does not use drugs.   Family History:  The patient's family history includes Alcohol abuse in his other; Cancer in his other; Heart disease in his other; Hypertension in his other; Stroke in his other.    ROS:  Please see the history of present illness. All other systems are reviewed and negative.    PHYSICAL EXAM: VS:  BP (!) 150/80  Pulse (!) 53   Ht 5\' 10"  (1.778 m)   Wt 194 lb 3.2 oz (88.1 kg)   BMI 27.86 kg/m  , BMI Body mass index is 27.86 kg/m. GEN: Well nourished, well developed, in no acute distress  HEENT: normal  Neck: no JVD, no masses. No carotid bruits Cardiac: bradycardiac and regular without murmur or gallop                Respiratory:  clear to auscultation bilaterally, normal work of breathing GI: soft, nontender, nondistended, + BS MS: no deformity or atrophy  Ext: no pretibial edema, pedal pulses 2+= bilaterally Skin: warm and dry, no rash Neuro:  Strength and sensation are intact Psych: euthymic mood, full affect  EKG:  EKG is ordered today. The ekg ordered today shows sinus bradycardia 53 bpm, right bundle branch block, left axis deviation.  Recent  Labs: 04/04/2016: ALT 16; BUN 19; Creatinine, Ser 1.25; Hemoglobin 16.0; Platelets 200.0; Potassium 5.5; Sodium 140; TSH 3.30   Lipid Panel     Component Value Date/Time   CHOL 119 04/04/2016 0949   TRIG 58.0 04/04/2016 0949   TRIG 67 06/08/2006 1049   HDL 38.80 (L) 04/04/2016 0949   CHOLHDL 3 04/04/2016 0949   VLDL 11.6 04/04/2016 0949   LDLCALC 68 04/04/2016 0949      Wt Readings from Last 3 Encounters:  06/01/16 194 lb 3.2 oz (88.1 kg)  04/04/16 195 lb (88.5 kg)  03/23/16 197 lb (89.4 kg)     ASSESSMENT AND PLAN: 1.  CAD, native vessel, without symptoms of angina: The patient appears to be doing well. He is 10 years out from multivessel CABG. He has no cardiac symptoms and continues on a secondary risk reduction program with aspirin, metoprolol, and Crestor. I will see him back in one year. We discussed considerations around surveillance stress testing, but exercise testing has some limitations with his wide right bundle branch block. Considering his lack of symptoms will continue with observation and yearly clinical follow-up.  2. Hyperlipidemia: Recent lipids reviewed and at goal. LFTs in range.  3. Elevated blood pressure: Isolated reading. Normally blood pressure in good range based on home readings in previous office visits. He is under some stress this morning. We'll just keep an eye on this and not make any changes today.  Current medicines are reviewed with the patient today.  The patient does not have concerns regarding medicines.  Labs/ tests ordered today include:  No orders of the defined types were placed in this encounter.   Disposition:   FU one year  Signed, [name], MD  06/01/2016 8:10 AM    [facility] [address], [place], [place]  [address] Phone: [phone]; Fax: [phone]

## 2016-06-14 ENCOUNTER — Other Ambulatory Visit: Payer: Self-pay | Admitting: Family Medicine

## 2017-01-08 ENCOUNTER — Other Ambulatory Visit: Payer: Self-pay | Admitting: Family Medicine

## 2017-01-08 DIAGNOSIS — I2510 Atherosclerotic heart disease of native coronary artery without angina pectoris: Secondary | ICD-10-CM

## 2017-01-08 DIAGNOSIS — I10 Essential (primary) hypertension: Secondary | ICD-10-CM

## 2017-01-08 DIAGNOSIS — E785 Hyperlipidemia, unspecified: Secondary | ICD-10-CM

## 2017-03-17 ENCOUNTER — Encounter: Payer: Self-pay | Admitting: Family Medicine

## 2017-03-19 NOTE — Telephone Encounter (Signed)
He can just drop the form off and I will sign it

## 2017-03-22 ENCOUNTER — Encounter: Payer: Self-pay | Admitting: Family Medicine

## 2017-03-31 ENCOUNTER — Other Ambulatory Visit: Payer: Self-pay | Admitting: Family Medicine

## 2017-04-03 ENCOUNTER — Other Ambulatory Visit: Payer: Self-pay | Admitting: Family Medicine

## 2017-04-03 DIAGNOSIS — E785 Hyperlipidemia, unspecified: Secondary | ICD-10-CM

## 2017-04-03 DIAGNOSIS — I2510 Atherosclerotic heart disease of native coronary artery without angina pectoris: Secondary | ICD-10-CM

## 2017-04-03 DIAGNOSIS — I10 Essential (primary) hypertension: Secondary | ICD-10-CM

## 2017-04-03 NOTE — Telephone Encounter (Signed)
Refill request for Lopressor, Crestor, Zyloprim and send a 90 day supply to [facility].

## 2017-04-05 MED ORDER — ROSUVASTATIN CALCIUM 10 MG PO TABS: 10.0000 mg | ORAL_TABLET | Freq: Every day | ORAL | 0 refills | Status: DC

## 2017-04-05 MED ORDER — METOPROLOL TARTRATE 50 MG PO TABS: 50.0000 mg | ORAL_TABLET | Freq: Two times a day (BID) | ORAL | 0 refills | Status: DC

## 2017-04-05 MED ORDER — ALLOPURINOL 300 MG PO TABS: 300.0000 mg | ORAL_TABLET | Freq: Every day | ORAL | 0 refills | Status: DC

## 2017-04-05 NOTE — Telephone Encounter (Signed)
I sent all 3 scripts e-scribe to [facility].

## 2017-05-02 DIAGNOSIS — Z23 Encounter for immunization: Secondary | ICD-10-CM | POA: Diagnosis not present

## 2017-05-09 ENCOUNTER — Encounter: Payer: Self-pay | Admitting: Family Medicine

## 2017-05-09 ENCOUNTER — Ambulatory Visit (INDEPENDENT_AMBULATORY_CARE_PROVIDER_SITE_OTHER): Payer: Medicare Other | Admitting: Family Medicine

## 2017-05-09 VITALS — BP 140/90 | HR 51 | Temp 98.0°F | Ht 70.0 in | Wt 193.0 lb

## 2017-05-09 DIAGNOSIS — E782 Mixed hyperlipidemia: Secondary | ICD-10-CM | POA: Diagnosis not present

## 2017-05-09 DIAGNOSIS — E785 Hyperlipidemia, unspecified: Secondary | ICD-10-CM | POA: Diagnosis not present

## 2017-05-09 DIAGNOSIS — N138 Other obstructive and reflux uropathy: Secondary | ICD-10-CM

## 2017-05-09 DIAGNOSIS — I10 Essential (primary) hypertension: Secondary | ICD-10-CM

## 2017-05-09 DIAGNOSIS — Z23 Encounter for immunization: Secondary | ICD-10-CM | POA: Diagnosis not present

## 2017-05-09 DIAGNOSIS — M1000 Idiopathic gout, unspecified site: Secondary | ICD-10-CM | POA: Diagnosis not present

## 2017-05-09 DIAGNOSIS — N401 Enlarged prostate with lower urinary tract symptoms: Secondary | ICD-10-CM | POA: Diagnosis not present

## 2017-05-09 DIAGNOSIS — I2510 Atherosclerotic heart disease of native coronary artery without angina pectoris: Secondary | ICD-10-CM

## 2017-05-09 DIAGNOSIS — E119 Type 2 diabetes mellitus without complications: Secondary | ICD-10-CM

## 2017-05-09 LAB — HEPATIC FUNCTION PANEL
ALBUMIN: 4.5 g/dL (ref 3.5–5.2)
ALT: 15 U/L (ref 0–53)
AST: 18 U/L (ref 0–37)
Alkaline Phosphatase: 45 U/L (ref 39–117)
BILIRUBIN DIRECT: 0.2 mg/dL (ref 0.0–0.3)
TOTAL PROTEIN: 6.8 g/dL (ref 6.0–8.3)
Total Bilirubin: 0.8 mg/dL (ref 0.2–1.2)

## 2017-05-09 LAB — LIPID PANEL
CHOLESTEROL: 124 mg/dL (ref 0–200)
HDL: 37.6 mg/dL — AB (ref >39.00)
LDL Cholesterol: 67 mg/dL (ref 0–99)
NONHDL: 85.95
Total CHOL/HDL Ratio: 3
Triglycerides: 96 mg/dL (ref 0.0–149.0)
VLDL: 19.2 mg/dL (ref 0.0–40.0)

## 2017-05-09 LAB — CBC WITH DIFFERENTIAL/PLATELET
Basophils Absolute: 0 10*3/uL (ref 0.0–0.1)
Basophils Relative: 0.7 % (ref 0.0–3.0)
Eosinophils Absolute: 0.3 10*3/uL (ref 0.0–0.7)
Eosinophils Relative: 5.1 % — ABNORMAL HIGH (ref 0.0–5.0)
HCT: 45.1 % (ref 39.0–52.0)
Hemoglobin: 14.9 g/dL (ref 13.0–17.0)
Lymphocytes Relative: 20.6 % (ref 12.0–46.0)
Lymphs Abs: 1 10*3/uL (ref 0.7–4.0)
MCHC: 33.2 g/dL (ref 30.0–36.0)
MCV: 90.4 fl (ref 78.0–100.0)
Monocytes Absolute: 0.4 10*3/uL (ref 0.1–1.0)
Monocytes Relative: 9.1 % (ref 3.0–12.0)
Neutro Abs: 3.2 10*3/uL (ref 1.4–7.7)
Neutrophils Relative %: 64.5 % (ref 43.0–77.0)
Platelets: 187 10*3/uL (ref 150.0–400.0)
RBC: 4.99 Mil/uL (ref 4.22–5.81)
RDW: 14.8 % (ref 11.5–15.5)
WBC: 4.9 10*3/uL (ref 4.0–10.5)

## 2017-05-09 LAB — POC URINALSYSI DIPSTICK (AUTOMATED)
Bilirubin, UA: NEGATIVE
Glucose, UA: NEGATIVE
Ketones, UA: NEGATIVE
Leukocytes, UA: NEGATIVE
Nitrite, UA: NEGATIVE
Protein, UA: NEGATIVE
Spec Grav, UA: 1.02 (ref 1.010–1.025)
Urobilinogen, UA: 0.2 E.U./dL
pH, UA: 6 (ref 5.0–8.0)

## 2017-05-09 LAB — BASIC METABOLIC PANEL
BUN: 18 mg/dL (ref 6–23)
CALCIUM: 9.8 mg/dL (ref 8.4–10.5)
CO2: 31 meq/L (ref 19–32)
Chloride: 103 mEq/L (ref 96–112)
Creatinine, Ser: 1.23 mg/dL (ref 0.40–1.50)
GFR: 61.51 mL/min (ref >60.00)
GLUCOSE: 109 mg/dL — AB (ref 70–99)
Potassium: 4.2 mEq/L (ref 3.5–5.1)
SODIUM: 141 meq/L (ref 135–145)

## 2017-05-09 LAB — PSA: PSA: 0.91 ng/mL (ref 0.10–4.00)

## 2017-05-09 LAB — TSH: TSH: 2.12 u[IU]/mL (ref 0.35–4.50)

## 2017-05-09 LAB — HEMOGLOBIN A1C: Hgb A1c MFr Bld: 5.5 % (ref 4.6–6.5)

## 2017-05-09 LAB — URIC ACID: Uric Acid, Serum: 5.3 mg/dL (ref 4.0–7.8)

## 2017-05-09 MED ORDER — METOPROLOL TARTRATE 50 MG PO TABS: 50.0000 mg | ORAL_TABLET | Freq: Two times a day (BID) | ORAL | 3 refills | Status: DC

## 2017-05-09 MED ORDER — ALLOPURINOL 300 MG PO TABS: 300.0000 mg | ORAL_TABLET | Freq: Every day | ORAL | 3 refills | Status: DC

## 2017-05-09 MED ORDER — ROSUVASTATIN CALCIUM 10 MG PO TABS: 10.0000 mg | ORAL_TABLET | Freq: Every day | ORAL | 3 refills | Status: DC

## 2017-05-09 NOTE — Progress Notes (Signed)
   Subjective:    Patient ID: Kevin Mcbride, male    DOB: 07-08-1945, 71 y.o.   MRN: [id number]  HPI Here to follow up. He feels great. His gout is well controlled. His BP is controlled. He still exercises daily. He will see Dr. Burt Knack for a cardiology follow up soon.    Review of Systems  Constitutional: Negative.   HENT: Negative.   Eyes: Negative.   Respiratory: Negative.   Cardiovascular: Negative.   Gastrointestinal: Negative.   Genitourinary: Negative.   Musculoskeletal: Negative.   Skin: Negative.   Neurological: Negative.   Psychiatric/Behavioral: Negative.        Objective:   Physical Exam  Constitutional: He is oriented to person, place, and time. He appears well-developed and well-nourished. No distress.  HENT:  Head: Normocephalic and atraumatic.  Right Ear: External ear normal.  Left Ear: External ear normal.  Nose: Nose normal.  Mouth/Throat: Oropharynx is clear and moist. No oropharyngeal exudate.  Eyes: Conjunctivae and EOM are normal. Pupils are equal, round, and reactive to light. Right eye exhibits no discharge. Left eye exhibits no discharge. No scleral icterus.  Neck: Neck supple. No JVD present. No tracheal deviation present. No thyromegaly present.  Cardiovascular: Normal rate, regular rhythm, normal heart sounds and intact distal pulses. Exam reveals no gallop and no friction rub.  No murmur heard. Pulmonary/Chest: Effort normal and breath sounds normal. No respiratory distress. He has no wheezes. He has no rales. He exhibits no tenderness.  Abdominal: Soft. Bowel sounds are normal. He exhibits no distension and no mass. There is no tenderness. There is no rebound and no guarding.  Genitourinary: Rectum normal, prostate normal and penis normal. Rectal exam shows guaiac negative stool. No penile tenderness.  Musculoskeletal: Normal range of motion. He exhibits no edema or tenderness.  Lymphadenopathy:    He has no cervical adenopathy.  Neurological:  He is alert and oriented to person, place, and time. He has normal reflexes. No cranial nerve deficit. He exhibits normal muscle tone. Coordination normal.  Skin: Skin is warm and dry. No rash noted. He is not diaphoretic. No erythema. No pallor.  Psychiatric: He has a normal mood and affect. His behavior is normal. Judgment and thought content normal.          Assessment & Plan:  He seems to be doing well. His HTN is stable. His gout is stable. He will get fasting labs today to check lipids and an A1c, etc.  Alysia Penna, MD

## 2017-05-09 NOTE — Patient Instructions (Signed)
WE NOW OFFER   [facility] FAST TRACK!!!  SAME DAY Appointments for ACUTE CARE  Such as: Sprains, Injuries, cuts, abrasions, rashes, muscle pain, joint pain, back pain Colds, flu, sore throats, headache, allergies, cough, fever  Ear pain, sinus and eye infections Abdominal pain, nausea, vomiting, diarrhea, upset stomach Animal/insect bites  3 Easy Ways to Schedule: Walk-In Scheduling Call in scheduling Mychart Sign-up: [email or web address]         

## 2017-06-06 ENCOUNTER — Ambulatory Visit (INDEPENDENT_AMBULATORY_CARE_PROVIDER_SITE_OTHER): Payer: Medicare Other | Admitting: Cardiovascular Disease

## 2017-06-06 ENCOUNTER — Encounter: Payer: Self-pay | Admitting: Cardiovascular Disease

## 2017-06-06 VITALS — BP 136/80 | HR 49 | Ht 70.0 in | Wt 190.8 lb

## 2017-06-06 DIAGNOSIS — E782 Mixed hyperlipidemia: Secondary | ICD-10-CM

## 2017-06-06 DIAGNOSIS — I2510 Atherosclerotic heart disease of native coronary artery without angina pectoris: Secondary | ICD-10-CM | POA: Diagnosis not present

## 2017-06-06 NOTE — Patient Instructions (Signed)

## 2017-06-06 NOTE — Progress Notes (Signed)
Cardiology Office Note Date:  06/06/2017   ID:  Kevin Mcbride, DOB 1945/11/01, MRN [id number]  PCP:  [name], MD  Cardiologist:  [name], MD    Chief Complaint  Patient presents with  . Follow-up    CAD     History of Present Illness: Kevin Mcbride is a 71 y.o. male who presents for follow-up of coronary artery disease.  The patient has multivessel CAD and underwent CABG in 2007, treated with a LIMA to LAD, saphenous vein graft to diagonal, and saphenous vein graft to obtuse marginal.  He has had normal left ventricular function.  He is physically active with no exertional symptoms.  He continues to exercise regularly. Today, he denies symptoms of palpitations, chest pain, shortness of breath, orthopnea, PND, lower extremity edema, dizziness, or syncope.   Past Medical History:  Diagnosis Date  . CAD (coronary artery disease)    post CABG.  RBB. no symptomatic concerns, sees Dr. [name]   . Diabetes mellitus    one time elevated blood sugar  . DJD (degenerative joint disease)    neck  . ED (erectile dysfunction)   . Gout   . Hyperlipidemia   . Hypertension   . Myocardial infarct ([place]) 07/03/06  . Vitiligo    sees Dr. [name]     Past Surgical History:  Procedure Laterality Date  . cardiac bypass  2009   triple  . COLONOSCOPY  03/23/2016   per Dr. Fuller Plan,  adenomatous polyp, repeat in 5 yrs     Current Outpatient Medications  Medication Sig Dispense Refill  . allopurinol (ZYLOPRIM) 300 MG tablet Take 1 tablet (300 mg total) daily by mouth. 90 tablet 3  . aspirin 81 MG tablet Take 81 mg by mouth daily.      . metoprolol tartrate (LOPRESSOR) 50 MG tablet Take 1 tablet (50 mg total) 2 (two) times daily by mouth. 180 tablet 3  . Multiple Vitamins-Minerals (CENTRUM PO) Take 1 tablet by mouth daily.     Marland Kitchen NAPROXEN DR 500 MG EC tablet TAKE 1 TABLET BY MOUTH TWO  TIMES DAILY AS NEEDED FOR  PAIN 180 tablet 0  . rosuvastatin (CRESTOR) 10 MG  tablet Take 1 tablet (10 mg total) daily by mouth. 90 tablet 3  . tadalafil (CIALIS) 20 MG tablet Take 20 mg by mouth daily as needed for erectile dysfunction.      Current Facility-Administered Medications  Medication Dose Route Frequency Provider Last Rate Last Dose  . 0.9 %  sodium chloride infusion  500 mL Intravenous Continuous Ladene Artist, MD        Allergies:   Patient has no known allergies.   Social History:  The patient  reports that  has never smoked. he has never used smokeless tobacco. He reports that he drinks about 1.2 oz of alcohol per week. He reports that he does not use drugs.   Family History:  The patient's family history includes Alcohol abuse in his other; Cancer in his other; Heart disease in his other; Hypertension in his other; Stroke in his other.    ROS:  Please see the history of present illness.  All other systems are reviewed and negative.    PHYSICAL EXAM: VS:  BP 136/80   Pulse (!) 49   Ht 5\' 10"  (1.778 m)   Wt 190 lb 12.8 oz (86.5 kg)   SpO2 94%   BMI 27.38 kg/m  , BMI Body mass index is  27.38 kg/m. GEN: Well nourished, well developed, in no acute distress  HEENT: normal  Neck: no JVD, no masses. No carotid bruits Cardiac: RRR with widely split physiologic S2                Respiratory:  clear to auscultation bilaterally, normal work of breathing GI: soft, nontender, nondistended, + BS MS: no deformity or atrophy  Ext: no pretibial edema, pedal pulses 2+= bilaterally Skin: warm and dry, no rash Neuro:  Strength and sensation are intact Psych: euthymic mood, full affect  EKG:  EKG is ordered today. The ekg ordered today shows marked sinus bradycardia with RBBB, unchanged from prior tracings  Recent Labs: 05/09/2017: ALT 15; BUN 18; Creatinine, Ser 1.23; Hemoglobin 14.9; Platelets 187.0; Potassium 4.2; Sodium 141; TSH 2.12   Lipid Panel     Component Value Date/Time   CHOL 124 05/09/2017 0929   TRIG 96.0 05/09/2017 0929   TRIG 67  06/08/2006 1049   HDL 37.60 (L) 05/09/2017 0929   CHOLHDL 3 05/09/2017 0929   VLDL 19.2 05/09/2017 0929   LDLCALC 67 05/09/2017 0929      Wt Readings from Last 3 Encounters:  06/06/17 190 lb 12.8 oz (86.5 kg)  05/09/17 193 lb (87.5 kg)  06/01/16 194 lb 3.2 oz (88.1 kg)     ASSESSMENT AND PLAN: 1.  Coronary artery disease, native vessel, without angina: medications reviewed and no changes recommended today. FU one year.   2. HTN: Blood pressure well controlled on current regimen.  3.  Hyperlipidemia: LDL cholesterol is at goal at less than 70 on crestor.   Current medicines are reviewed with the patient today.  The patient does not have concerns regarding medicines.  Labs/ tests ordered today include:   Orders Placed This Encounter  Procedures  . EKG 12-Lead    Disposition:   FU one year  Signed, [name], MD  06/06/2017 3:49 PM    [facility] [address], [place], [place]  [address] Phone: [phone]; Fax: [phone]

## 2017-06-13 ENCOUNTER — Encounter: Payer: Self-pay | Admitting: Cardiovascular Disease

## 2018-03-25 DIAGNOSIS — Z23 Encounter for immunization: Secondary | ICD-10-CM | POA: Diagnosis not present

## 2018-03-26 ENCOUNTER — Encounter: Payer: Medicare Other | Admitting: Family Medicine

## 2018-05-13 ENCOUNTER — Ambulatory Visit (INDEPENDENT_AMBULATORY_CARE_PROVIDER_SITE_OTHER): Payer: Medicare Other | Admitting: Family Medicine

## 2018-05-13 ENCOUNTER — Encounter: Payer: Self-pay | Admitting: Family Medicine

## 2018-05-13 VITALS — BP 126/82 | HR 54 | Temp 97.5°F | Ht 70.0 in | Wt 185.4 lb

## 2018-05-13 DIAGNOSIS — M1000 Idiopathic gout, unspecified site: Secondary | ICD-10-CM | POA: Diagnosis not present

## 2018-05-13 DIAGNOSIS — N401 Enlarged prostate with lower urinary tract symptoms: Secondary | ICD-10-CM | POA: Diagnosis not present

## 2018-05-13 DIAGNOSIS — N138 Other obstructive and reflux uropathy: Secondary | ICD-10-CM | POA: Diagnosis not present

## 2018-05-13 DIAGNOSIS — E782 Mixed hyperlipidemia: Secondary | ICD-10-CM | POA: Diagnosis not present

## 2018-05-13 DIAGNOSIS — E119 Type 2 diabetes mellitus without complications: Secondary | ICD-10-CM

## 2018-05-13 DIAGNOSIS — I252 Old myocardial infarction: Secondary | ICD-10-CM

## 2018-05-13 DIAGNOSIS — I10 Essential (primary) hypertension: Secondary | ICD-10-CM | POA: Diagnosis not present

## 2018-05-13 DIAGNOSIS — I2510 Atherosclerotic heart disease of native coronary artery without angina pectoris: Secondary | ICD-10-CM | POA: Diagnosis not present

## 2018-05-13 DIAGNOSIS — E785 Hyperlipidemia, unspecified: Secondary | ICD-10-CM | POA: Diagnosis not present

## 2018-05-13 LAB — CBC WITH DIFFERENTIAL/PLATELET
Basophils Absolute: 0 10*3/uL (ref 0.0–0.1)
Basophils Relative: 0.8 % (ref 0.0–3.0)
EOS ABS: 0.3 10*3/uL (ref 0.0–0.7)
EOS PCT: 4.6 % (ref 0.0–5.0)
HCT: 46.4 % (ref 39.0–52.0)
HEMOGLOBIN: 15.8 g/dL (ref 13.0–17.0)
Lymphocytes Relative: 20.7 % (ref 12.0–46.0)
Lymphs Abs: 1.2 10*3/uL (ref 0.7–4.0)
MCHC: 34.1 g/dL (ref 30.0–36.0)
MCV: 88.5 fl (ref 78.0–100.0)
MONO ABS: 0.6 10*3/uL (ref 0.1–1.0)
Monocytes Relative: 10 % (ref 3.0–12.0)
NEUTROS PCT: 63.9 % (ref 43.0–77.0)
Neutro Abs: 3.6 10*3/uL (ref 1.4–7.7)
Platelets: 183 10*3/uL (ref 150.0–400.0)
RBC: 5.24 Mil/uL (ref 4.22–5.81)
RDW: 14.7 % (ref 11.5–15.5)
WBC: 5.7 10*3/uL (ref 4.0–10.5)

## 2018-05-13 LAB — HEPATIC FUNCTION PANEL
ALT: 16 U/L (ref 0–53)
AST: 18 U/L (ref 0–37)
Albumin: 4.6 g/dL (ref 3.5–5.2)
Alkaline Phosphatase: 47 U/L (ref 39–117)
BILIRUBIN DIRECT: 0.2 mg/dL (ref 0.0–0.3)
BILIRUBIN TOTAL: 0.7 mg/dL (ref 0.2–1.2)
Total Protein: 6.9 g/dL (ref 6.0–8.3)

## 2018-05-13 LAB — POC URINALSYSI DIPSTICK (AUTOMATED)
Bilirubin, UA: NEGATIVE
Glucose, UA: NEGATIVE
KETONES UA: NEGATIVE
LEUKOCYTES UA: NEGATIVE
NITRITE UA: NEGATIVE
PROTEIN UA: NEGATIVE
RBC UA: NEGATIVE
SPEC GRAV UA: 1.02 (ref 1.010–1.025)
Urobilinogen, UA: 0.2 E.U./dL
pH, UA: 6 (ref 5.0–8.0)

## 2018-05-13 LAB — BASIC METABOLIC PANEL
BUN: 18 mg/dL (ref 6–23)
CALCIUM: 9.8 mg/dL (ref 8.4–10.5)
CO2: 29 meq/L (ref 19–32)
Chloride: 102 mEq/L (ref 96–112)
Creatinine, Ser: 1.31 mg/dL (ref 0.40–1.50)
GFR: 57.04 mL/min — AB (ref >60.00)
Glucose, Bld: 110 mg/dL — ABNORMAL HIGH (ref 70–99)
Potassium: 4.4 mEq/L (ref 3.5–5.1)
SODIUM: 138 meq/L (ref 135–145)

## 2018-05-13 LAB — TSH: TSH: 3.42 u[IU]/mL (ref 0.35–4.50)

## 2018-05-13 LAB — PSA: PSA: 1.3 ng/mL (ref 0.10–4.00)

## 2018-05-13 LAB — LIPID PANEL
Cholesterol: 117 mg/dL (ref 0–200)
HDL: 39.1 mg/dL (ref >39.00)
LDL CALC: 61 mg/dL (ref 0–99)
NONHDL: 78.02
Total CHOL/HDL Ratio: 3
Triglycerides: 87 mg/dL (ref 0.0–149.0)
VLDL: 17.4 mg/dL (ref 0.0–40.0)

## 2018-05-13 LAB — URIC ACID: URIC ACID, SERUM: 5.1 mg/dL (ref 4.0–7.8)

## 2018-05-13 LAB — HEMOGLOBIN A1C: HEMOGLOBIN A1C: 5.4 % (ref 4.6–6.5)

## 2018-05-13 MED ORDER — METOPROLOL TARTRATE 50 MG PO TABS: 50.0000 mg | ORAL_TABLET | Freq: Two times a day (BID) | ORAL | 3 refills | Status: DC

## 2018-05-13 MED ORDER — ALLOPURINOL 300 MG PO TABS: 300.0000 mg | ORAL_TABLET | Freq: Every day | ORAL | 3 refills | Status: DC

## 2018-05-13 MED ORDER — ROSUVASTATIN CALCIUM 10 MG PO TABS: 10.0000 mg | ORAL_TABLET | Freq: Every day | ORAL | 3 refills | Status: DC

## 2018-05-13 NOTE — Progress Notes (Signed)
   Subjective:    Patient ID: Kevin Mcbride, male    DOB: 08/31/1945, 72 y.o.   MRN: [id number]  HPI Here to follow up on issues. He feels great. He works out at [employer] 5 days a week. He watches his diet closely. His BP is stable.    Review of Systems  Constitutional: Negative.   HENT: Negative.   Eyes: Negative.   Respiratory: Negative.   Cardiovascular: Negative.   Gastrointestinal: Negative.   Genitourinary: Negative.   Musculoskeletal: Negative.   Skin: Negative.   Neurological: Negative.   Psychiatric/Behavioral: Negative.        Objective:   Physical Exam  Constitutional: He is oriented to person, place, and time. He appears well-developed and well-nourished. No distress.  HENT:  Head: Normocephalic and atraumatic.  Right Ear: External ear normal.  Left Ear: External ear normal.  Nose: Nose normal.  Mouth/Throat: Oropharynx is clear and moist. No oropharyngeal exudate.  Eyes: Pupils are equal, round, and reactive to light. Conjunctivae and EOM are normal. Right eye exhibits no discharge. Left eye exhibits no discharge. No scleral icterus.  Neck: Neck supple. No JVD present. No tracheal deviation present. No thyromegaly present.  Cardiovascular: Normal rate, regular rhythm, normal heart sounds and intact distal pulses. Exam reveals no gallop and no friction rub.  No murmur heard. Pulmonary/Chest: Effort normal and breath sounds normal. No respiratory distress. He has no wheezes. He has no rales. He exhibits no tenderness.  Abdominal: Soft. Bowel sounds are normal. He exhibits no distension and no mass. There is no tenderness. There is no rebound and no guarding.  Genitourinary: Rectum normal, prostate normal and penis normal. Rectal exam shows guaiac negative stool. No penile tenderness.  Musculoskeletal: Normal range of motion. He exhibits no edema or tenderness.  Lymphadenopathy:    He has no cervical adenopathy.  Neurological: He is alert and oriented to person,  place, and time. He has normal reflexes. He displays normal reflexes. No cranial nerve deficit. He exhibits normal muscle tone. Coordination normal.  Skin: Skin is warm and dry. No rash noted. He is not diaphoretic. No erythema. No pallor.  Psychiatric: He has a normal mood and affect. His behavior is normal. Judgment and thought content normal.          Assessment & Plan:  His HTN is stable. His gout is stable. We will get fasting labs including an A1c for the diabetes. Check lipids. Alysia Penna, MD

## 2018-05-20 ENCOUNTER — Encounter: Payer: Self-pay | Admitting: *Deleted

## 2018-06-06 NOTE — Progress Notes (Signed)
Cardiology Office Note:    Date:  06/07/2018   ID:  Kevin Mcbride, DOB 19-May-1946, MRN [id number]  PCP:  [name], MD  Cardiologist:  [name], MD  Electrophysiologist:  None   Referring MD: [name], MD   Chief Complaint  Patient presents with  . Follow-up    CAD    History of Present Illness:    Kevin Mcbride is a 72 y.o. male with coronary artery disease status post CABG in 2007, diabetes, hypertension, hyperlipidemia.  He was last seen by Dr. Burt Knack in December 2018.     [name] returns for follow up.  He is here alone.  He continues to exercise 5 times a week.  He lifts light weights and does interval training.  He has had to hold off on doing aerobic activity recently due to some back pain.  He is trying to slowly resume.  He has not had any chest pain, shortness of breath, syncope, leg swelling.  He has been under some increased stress recently.  His daughter has breast CA and is undergoing treatment.    Prior CV studies:   The following studies were reviewed today:  Cardiac catheterization 07/2006  ASSESSMENT:  1. Critical left main disease.  2. Chronic right coronary artery occlusion.  3. Normal left ventricular function.  Past Medical History:  Diagnosis Date  . CAD (coronary artery disease)    post CABG.  RBB. no symptomatic concerns, sees Dr. [name]   . Diabetes mellitus    one time elevated blood sugar  . DJD (degenerative joint disease)    neck  . ED (erectile dysfunction)   . Gout   . Hyperlipidemia   . Hypertension   . Myocardial infarct ([place]) 07/03/06  . Vitiligo    sees Dr. [name]    Surgical Hx: The patient  has a past surgical history that includes cardiac bypass (2009) and Colonoscopy (03/23/2016).   Current Medications: Current Meds  Medication Sig  . allopurinol (ZYLOPRIM) 300 MG tablet Take 1 tablet (300 mg total) by mouth daily.  Marland Kitchen aspirin 81 MG tablet Take 81 mg by mouth daily.    . metoprolol  tartrate (LOPRESSOR) 50 MG tablet Take 1 tablet (50 mg total) by mouth 2 (two) times daily.  . Multiple Vitamins-Minerals (CENTRUM PO) Take 1 tablet by mouth daily.   Marland Kitchen NAPROXEN DR 500 MG EC tablet TAKE 1 TABLET BY MOUTH TWO  TIMES DAILY AS NEEDED FOR  PAIN  . rosuvastatin (CRESTOR) 10 MG tablet Take 1 tablet (10 mg total) by mouth daily.   Current Facility-Administered Medications for the 06/07/18 encounter (Office Visit) with Richardson Dopp T, PA-C  Medication  . 0.9 %  sodium chloride infusion     Allergies:   Patient has no known allergies.   Social History   Tobacco Use  . Smoking status: Never Smoker  . Smokeless tobacco: Never Used  Substance Use Topics  . Alcohol use: Yes    Alcohol/week: 2.0 standard drinks    Types: 2 Glasses of wine per week  . Drug use: No     Family Hx: The patient's family history includes Alcohol abuse in his other; Cancer in his other; Heart disease in his other; Hypertension in his other; Stroke in his other. There is no history of Colon cancer, Colon polyps, Rectal cancer, or Stomach cancer.  ROS:   Please see the history of present illness.    ROS All other systems  reviewed and are negative.   EKGs/Labs/Other Test Reviewed:    EKG:  EKG is  ordered today.  The ekg ordered today demonstrates normal sinus rhythm, heart rate 63, leftward axis, right bundle branch block QTC 458, similar to prior tracing  Recent Labs: 05/13/2018: ALT 16; BUN 18; Creatinine, Ser 1.31; Hemoglobin 15.8; Platelets 183.0; Potassium 4.4; Sodium 138; TSH 3.42   Recent Lipid Panel Lab Results  Component Value Date/Time   CHOL 117 05/13/2018 09:28 AM   TRIG 87.0 05/13/2018 09:28 AM   TRIG 67 06/08/2006 10:49 AM   HDL 39.10 05/13/2018 09:28 AM   CHOLHDL 3 05/13/2018 09:28 AM   LDLCALC 61 05/13/2018 09:28 AM    Physical Exam:    VS:  BP (!) 150/90   Pulse 63   Ht 5\' 10"  (1.778 m)   Wt 186 lb (84.4 kg)   SpO2 98%   BMI 26.69 kg/m     Wt Readings from Last 3  Encounters:  06/07/18 186 lb (84.4 kg)  05/13/18 185 lb 6 oz (84.1 kg)  06/06/17 190 lb 12.8 oz (86.5 kg)     Physical Exam  Constitutional: He is oriented to person, place, and time. He appears well-developed and well-nourished. No distress.  HENT:  Head: Normocephalic and atraumatic.  Eyes: No scleral icterus.  Neck: No JVD present. Carotid bruit is not present.  Cardiovascular: Normal rate and regular rhythm.  No murmur heard. Pulmonary/Chest: Effort normal. He has no rales.  Abdominal: Soft. He exhibits no distension.  Musculoskeletal: He exhibits no edema.  Neurological: He is alert and oriented to person, place, and time.  Skin: Skin is warm and dry.    ASSESSMENT & PLAN:    Coronary artery disease involving native coronary artery of native heart without angina pectoris  History of CABG in 2007.  He is doing well without anginal symptoms.  He remains quite active.  Continue aspirin, statin, beta-blocker.  Essential hypertension Blood pressure elevated today.  Repeat by me is 160/80.  His blood pressure with primary care last month was optimal.  I have asked him to monitor his blood pressure and send me readings after about 2 weeks.  If his blood pressure is consistently above target, consider adding lisinopril or amlodipine.  Mixed hyperlipidemia  LDL optimal on most recent lab work.  Continue current Rx.     Dispo:  Return in about 1 year (around 06/08/2019) for Routine Follow Up, w/ Dr. Burt Knack.   Medication Adjustments/Labs and Tests Ordered: Current medicines are reviewed at length with the patient today.  Concerns regarding medicines are outlined above.  Tests Ordered: Orders Placed This Encounter  Procedures  . EKG 12-Lead   Medication Changes: No orders of the defined types were placed in this encounter.   Signed, Richardson Dopp, PA-C  06/07/2018 8:41 AM    [facility] [address], [place], [place]  [address] Phone: [phone];  Fax: [phone]

## 2018-06-07 ENCOUNTER — Encounter: Payer: Self-pay | Admitting: Physician Assistant

## 2018-06-07 ENCOUNTER — Ambulatory Visit (INDEPENDENT_AMBULATORY_CARE_PROVIDER_SITE_OTHER): Payer: Medicare Other | Admitting: Physician Assistant

## 2018-06-07 VITALS — BP 150/90 | HR 63 | Ht 70.0 in | Wt 186.0 lb

## 2018-06-07 DIAGNOSIS — I10 Essential (primary) hypertension: Secondary | ICD-10-CM

## 2018-06-07 DIAGNOSIS — I2510 Atherosclerotic heart disease of native coronary artery without angina pectoris: Secondary | ICD-10-CM | POA: Diagnosis not present

## 2018-06-07 DIAGNOSIS — E782 Mixed hyperlipidemia: Secondary | ICD-10-CM

## 2018-06-07 NOTE — Patient Instructions (Addendum)
Medication Instructions:  Your physician recommends that you continue on your current medications as directed. Please refer to the Current Medication list given to you today.  If you need a refill on your cardiac medications before your next appointment, please call your pharmacy.   Lab work: NONE If you have labs (blood work) drawn today and your tests are completely normal, you will receive your results only by: Marland Kitchen MyChart Message (if you have MyChart) OR . A paper copy in the mail If you have any lab test that is abnormal or we need to change your treatment, we will call you to review the results.  Testing/Procedures: NONE  Follow-Up: At [facility], you and your health needs are our priority.  As part of our continuing mission to provide you with exceptional heart care, we have created designated Provider Care Teams.  These Care Teams include your primary Cardiologist (physician) and Advanced Practice Providers (APPs -  Physician Assistants and Nurse Practitioners) who all work together to provide you with the care you need, when you need it. You will need a follow up appointment in:  1 years.  Please call our office 2 months in advance to schedule this appointment.  You may see [name], MD or one of the following Advanced Practice Providers on your designated Care Team: Richardson Dopp, PA-C [place], [place] . Daune Perch, NP  Any Other Special Instructions Will Be Listed Below (If Applicable). PLEASE MONITOR BLOOD PRESSURE AND WRITE DOWN READINGS OVER THE NEXT COUPLE WEEKS. SEND READINGS TO [employer], PA-C

## 2018-07-14 ENCOUNTER — Encounter

## 2019-03-31 DIAGNOSIS — Z23 Encounter for immunization: Secondary | ICD-10-CM | POA: Diagnosis not present

## 2019-05-19 ENCOUNTER — Ambulatory Visit (INDEPENDENT_AMBULATORY_CARE_PROVIDER_SITE_OTHER): Payer: Medicare Other | Admitting: Family Medicine

## 2019-05-19 ENCOUNTER — Encounter: Payer: Self-pay | Admitting: Family Medicine

## 2019-05-19 ENCOUNTER — Other Ambulatory Visit: Payer: Self-pay | Admitting: Family Medicine

## 2019-05-19 ENCOUNTER — Other Ambulatory Visit: Payer: Self-pay

## 2019-05-19 ENCOUNTER — Telehealth: Payer: Self-pay | Admitting: Family Medicine

## 2019-05-19 VITALS — BP 140/80 | HR 50 | Temp 97.3°F | Ht 69.75 in | Wt 185.0 lb

## 2019-05-19 DIAGNOSIS — M1000 Idiopathic gout, unspecified site: Secondary | ICD-10-CM

## 2019-05-19 DIAGNOSIS — E119 Type 2 diabetes mellitus without complications: Secondary | ICD-10-CM

## 2019-05-19 DIAGNOSIS — N138 Other obstructive and reflux uropathy: Secondary | ICD-10-CM

## 2019-05-19 DIAGNOSIS — H6123 Impacted cerumen, bilateral: Secondary | ICD-10-CM | POA: Diagnosis not present

## 2019-05-19 DIAGNOSIS — N401 Enlarged prostate with lower urinary tract symptoms: Secondary | ICD-10-CM | POA: Diagnosis not present

## 2019-05-19 DIAGNOSIS — I10 Essential (primary) hypertension: Secondary | ICD-10-CM

## 2019-05-19 DIAGNOSIS — E785 Hyperlipidemia, unspecified: Secondary | ICD-10-CM

## 2019-05-19 DIAGNOSIS — I2510 Atherosclerotic heart disease of native coronary artery without angina pectoris: Secondary | ICD-10-CM

## 2019-05-19 LAB — HEPATIC FUNCTION PANEL
ALT: 17 U/L (ref 0–53)
AST: 19 U/L (ref 0–37)
Albumin: 4.8 g/dL (ref 3.5–5.2)
Alkaline Phosphatase: 46 U/L (ref 39–117)
Bilirubin, Direct: 0.2 mg/dL (ref 0.0–0.3)
Total Bilirubin: 0.8 mg/dL (ref 0.2–1.2)
Total Protein: 7.1 g/dL (ref 6.0–8.3)

## 2019-05-19 LAB — CBC WITH DIFFERENTIAL/PLATELET
Basophils Absolute: 0.1 10*3/uL (ref 0.0–0.1)
Basophils Relative: 0.8 % (ref 0.0–3.0)
Eosinophils Absolute: 0.3 10*3/uL (ref 0.0–0.7)
Eosinophils Relative: 5 % (ref 0.0–5.0)
HCT: 45.3 % (ref 39.0–52.0)
Hemoglobin: 15.4 g/dL (ref 13.0–17.0)
Lymphocytes Relative: 21.3 % (ref 12.0–46.0)
Lymphs Abs: 1.3 10*3/uL (ref 0.7–4.0)
MCHC: 34.1 g/dL (ref 30.0–36.0)
MCV: 87.7 fl (ref 78.0–100.0)
Monocytes Absolute: 0.7 10*3/uL (ref 0.1–1.0)
Monocytes Relative: 10.5 % (ref 3.0–12.0)
Neutro Abs: 3.9 10*3/uL (ref 1.4–7.7)
Neutrophils Relative %: 62.4 % (ref 43.0–77.0)
Platelets: 193 10*3/uL (ref 150.0–400.0)
RBC: 5.16 Mil/uL (ref 4.22–5.81)
RDW: 15.5 % (ref 11.5–15.5)
WBC: 6.3 10*3/uL (ref 4.0–10.5)

## 2019-05-19 LAB — BASIC METABOLIC PANEL
BUN: 16 mg/dL (ref 6–23)
CO2: 28 mEq/L (ref 19–32)
Calcium: 9.8 mg/dL (ref 8.4–10.5)
Chloride: 104 mEq/L (ref 96–112)
Creatinine, Ser: 1.17 mg/dL (ref 0.40–1.50)
GFR: 60.97 mL/min (ref >60.00)
Glucose, Bld: 108 mg/dL — ABNORMAL HIGH (ref 70–99)
Potassium: 4.3 mEq/L (ref 3.5–5.1)
Sodium: 140 mEq/L (ref 135–145)

## 2019-05-19 LAB — HEMOGLOBIN A1C: Hgb A1c MFr Bld: 5.4 % (ref 4.6–6.5)

## 2019-05-19 LAB — LIPID PANEL
Cholesterol: 125 mg/dL (ref 0–200)
HDL: 41.8 mg/dL (ref >39.00)
LDL Cholesterol: 67 mg/dL (ref 0–99)
NonHDL: 83.57
Total CHOL/HDL Ratio: 3
Triglycerides: 81 mg/dL (ref 0.0–149.0)
VLDL: 16.2 mg/dL (ref 0.0–40.0)

## 2019-05-19 LAB — TSH: TSH: 3.5 u[IU]/mL (ref 0.35–4.50)

## 2019-05-19 LAB — PSA: PSA: 1.04 ng/mL (ref 0.10–4.00)

## 2019-05-19 LAB — URIC ACID: Uric Acid, Serum: 4.9 mg/dL (ref 4.0–7.8)

## 2019-05-19 MED ORDER — ALLOPURINOL 300 MG PO TABS: 300.0000 mg | ORAL_TABLET | Freq: Every day | ORAL | 3 refills | Status: DC

## 2019-05-19 MED ORDER — NAPROXEN 500 MG PO TBEC: DELAYED_RELEASE_TABLET | ORAL | 3 refills | Status: DC

## 2019-05-19 MED ORDER — ROSUVASTATIN CALCIUM 10 MG PO TABS: 10.0000 mg | ORAL_TABLET | Freq: Every day | ORAL | 3 refills | Status: DC

## 2019-05-19 MED ORDER — METOPROLOL TARTRATE 50 MG PO TABS: 50.0000 mg | ORAL_TABLET | Freq: Two times a day (BID) | ORAL | 3 refills | Status: DC

## 2019-05-19 NOTE — Telephone Encounter (Signed)
Kevin Mcbride is [facility] [employer].  Calling regarding EC Naproxen - Kevin Mcbride is requesting Naproxen.  Please advise CB- [phone]

## 2019-05-19 NOTE — Progress Notes (Signed)
Subjective:    Patient ID: Kevin Mcbride, male    DOB: Feb 06, 1946, 73 y.o.   MRN: [id number]  HPI Here to follow up on issues. He feels well. His BP has been stable. He had started going back to the gym recently, but due to concerns about the pandemic he has stopped this. He does go outside for walks every day.    Review of Systems  Constitutional: Negative.   HENT: Negative.   Eyes: Negative.   Respiratory: Negative.   Cardiovascular: Negative.   Gastrointestinal: Negative.   Genitourinary: Negative.   Musculoskeletal: Negative.   Skin: Negative.   Neurological: Negative.   Psychiatric/Behavioral: Negative.        Objective:   Physical Exam Constitutional:      General: He is not in acute distress.    Appearance: He is well-developed. He is not diaphoretic.  HENT:     Head: Normocephalic and atraumatic.     Ears:     Comments: Both ear canals are full of cerumen     Nose: Nose normal.     Mouth/Throat:     Pharynx: No oropharyngeal exudate.  Eyes:     General: No scleral icterus.       Right eye: No discharge.        Left eye: No discharge.     Conjunctiva/sclera: Conjunctivae normal.     Pupils: Pupils are equal, round, and reactive to light.  Neck:     Musculoskeletal: Neck supple.     Thyroid: No thyromegaly.     Vascular: No JVD.     Trachea: No tracheal deviation.  Cardiovascular:     Rate and Rhythm: Normal rate and regular rhythm.     Heart sounds: Normal heart sounds. No murmur. No friction rub. No gallop.   Pulmonary:     Effort: Pulmonary effort is normal. No respiratory distress.     Breath sounds: Normal breath sounds. No wheezing or rales.  Chest:     Chest wall: No tenderness.  Abdominal:     General: Bowel sounds are normal. There is no distension.     Palpations: Abdomen is soft. There is no mass.     Tenderness: There is no abdominal tenderness. There is no guarding or rebound.  Genitourinary:    Penis: Normal. No tenderness.       Scrotum/Testes: Normal.     Prostate: Normal.     Rectum: Normal. Guaiac result negative.  Musculoskeletal: Normal range of motion.        General: No tenderness.  Lymphadenopathy:     Cervical: No cervical adenopathy.  Skin:    General: Skin is warm and dry.     Coloration: Skin is not pale.     Findings: No erythema or rash.  Neurological:     Mental Status: He is alert and oriented to person, place, and time.     Cranial Nerves: No cranial nerve deficit.     Motor: No abnormal muscle tone.     Coordination: Coordination normal.     Deep Tendon Reflexes: Reflexes are normal and symmetric. Reflexes normal.  Psychiatric:        Behavior: Behavior normal.        Thought Content: Thought content normal.        Judgment: Judgment normal.           Assessment & Plan:  He seems to be doing well. He will follow up with Cardiology for his CAD in  a few weeks. His gout has been well controlled with Allopurinol. We will get fasting labs to check lipids, an A1c, etc. He will return later this week to have both cerumen impactions cleared. Alysia Penna, MD

## 2019-05-20 NOTE — Telephone Encounter (Signed)
Spoke to pharmacist and agreed that regular naproxen was ok to replace the delayed naproxen per Dr.Fry's orders.

## 2019-05-22 ENCOUNTER — Other Ambulatory Visit: Payer: Self-pay

## 2019-05-23 ENCOUNTER — Encounter: Payer: Self-pay | Admitting: Family Medicine

## 2019-05-23 ENCOUNTER — Ambulatory Visit (INDEPENDENT_AMBULATORY_CARE_PROVIDER_SITE_OTHER): Payer: Medicare Other | Admitting: Family Medicine

## 2019-05-23 VITALS — BP 130/68 | HR 57 | Temp 97.6°F | Ht 69.75 in | Wt 189.0 lb

## 2019-05-23 DIAGNOSIS — H6123 Impacted cerumen, bilateral: Secondary | ICD-10-CM | POA: Diagnosis not present

## 2019-05-23 DIAGNOSIS — I2510 Atherosclerotic heart disease of native coronary artery without angina pectoris: Secondary | ICD-10-CM

## 2019-05-23 NOTE — Patient Instructions (Signed)
Health Maintenance Due  Topic Date Due  . Hepatitis C Screening  1945-08-08  . FOOT EXAM  07/22/1955  . OPHTHALMOLOGY EXAM  07/22/1955  . TETANUS/TDAP  07/21/1964  . URINE MICROALBUMIN  04/01/2016  . INFLUENZA VACCINE  02/01/2019    Depression screen [facility] 2/9 05/13/2018 04/04/2016 04/02/2015  Decreased Interest 0 0 0  Down, Depressed, Hopeless 0 0 0  PHQ - 2 Score 0 0 0

## 2019-05-23 NOTE — Progress Notes (Signed)
   Subjective:    Patient ID: Kevin Mcbride, male    DOB: 15-Jul-1945, 73 y.o.   MRN: [id number]  HPI Here to have ear cerumen removed. It bothers his hearing. No pain.    Review of Systems  Constitutional: Negative.   HENT: Positive for hearing loss.   Respiratory: Negative.   Cardiovascular: Negative.        Objective:   Physical Exam Constitutional:      Appearance: Normal appearance.  HENT:     Ears:     Comments: Both ear canals are full of cerumen  Cardiovascular:     Rate and Rhythm: Normal rate and regular rhythm.     Pulses: Normal pulses.     Heart sounds: Normal heart sounds.  Pulmonary:     Effort: Pulmonary effort is normal.     Breath sounds: Normal breath sounds.  Neurological:     Mental Status: He is alert.           Assessment & Plan:  Cerumen impactions, both were irrigated clear with water.  Alysia Penna, MD

## 2019-06-11 ENCOUNTER — Other Ambulatory Visit: Payer: Self-pay | Admitting: Family Medicine

## 2019-06-11 DIAGNOSIS — E785 Hyperlipidemia, unspecified: Secondary | ICD-10-CM

## 2019-06-11 DIAGNOSIS — I2510 Atherosclerotic heart disease of native coronary artery without angina pectoris: Secondary | ICD-10-CM

## 2019-06-11 DIAGNOSIS — I10 Essential (primary) hypertension: Secondary | ICD-10-CM

## 2019-06-13 ENCOUNTER — Ambulatory Visit (INDEPENDENT_AMBULATORY_CARE_PROVIDER_SITE_OTHER): Payer: Medicare Other | Admitting: Cardiovascular Disease

## 2019-06-13 ENCOUNTER — Encounter: Payer: Self-pay | Admitting: Cardiovascular Disease

## 2019-06-13 ENCOUNTER — Other Ambulatory Visit: Payer: Self-pay

## 2019-06-13 VITALS — BP 148/80 | HR 53 | Ht 69.5 in | Wt 185.0 lb

## 2019-06-13 DIAGNOSIS — I2510 Atherosclerotic heart disease of native coronary artery without angina pectoris: Secondary | ICD-10-CM

## 2019-06-13 DIAGNOSIS — E782 Mixed hyperlipidemia: Secondary | ICD-10-CM | POA: Diagnosis not present

## 2019-06-13 DIAGNOSIS — I10 Essential (primary) hypertension: Secondary | ICD-10-CM

## 2019-06-13 NOTE — Progress Notes (Signed)
Cardiology Office Note:    Date:  06/13/2019   ID:  Kevin Mcbride, DOB [birth year], MRN [id number]  PCP:  [name], MD  Cardiologist:  [name], MD  Electrophysiologist:  None   Referring MD: [name], MD   Chief Complaint  Patient presents with  . Coronary Artery Disease    History of Present Illness:    Kevin Mcbride is a 73 y.o. male with a hx of coronary artery disease.  The patient has multivessel CAD and underwent CABG in 2007, treated with a LIMA to LAD, saphenous vein graft to diagonal, and saphenous vein graft to obtuse marginal.  He has had normal left ventricular function.  The patient is here alone today.  He is doing well.  He denies chest pain, shortness of breath, lightheadedness, heart palpitations, orthopnea, or PND.  He has not been as active as in the past because of the COVID-19 pandemic.  He used to work out regularly at [employer].  Past Medical History:  Diagnosis Date  . CAD (coronary artery disease)    post CABG.  RBB. no symptomatic concerns, sees Dr. [name]   . Diabetes mellitus    one time elevated blood sugar  . DJD (degenerative joint disease)    neck  . ED (erectile dysfunction)   . Gout   . Hyperlipidemia   . Hypertension   . Myocardial infarct ([place]) 07/03/06  . Vitiligo    sees Dr. [name]     Past Surgical History:  Procedure Laterality Date  . cardiac bypass  2009   triple  . COLONOSCOPY  03/23/2016   per Dr. Fuller Plan,  adenomatous polyp, repeat in 5 yrs     Current Medications: Current Meds  Medication Sig  . allopurinol (ZYLOPRIM) 300 MG tablet TAKE 1 TABLET BY MOUTH  DAILY  . aspirin 81 MG tablet Take 81 mg by mouth daily.    . metoprolol tartrate (LOPRESSOR) 50 MG tablet TAKE 1 TABLET BY MOUTH TWO  TIMES DAILY  . Multiple Vitamins-Minerals (CENTRUM PO) Take 1 tablet by mouth daily.   . naproxen (NAPROXEN DR) 500 MG EC tablet TAKE 1 TABLET BY MOUTH TWO  TIMES DAILY AS NEEDED FOR  PAIN  .  rosuvastatin (CRESTOR) 10 MG tablet TAKE 1 TABLET BY MOUTH  DAILY   Current Facility-Administered Medications for the 06/13/19 encounter (Office Visit) with [name], MD  Medication  . 0.9 %  sodium chloride infusion     Allergies:   Patient has no known allergies.   Social History   Socioeconomic History  . Marital status: Married    Spouse name: Not on file  . Number of children: Not on file  . Years of education: Not on file  . Highest education level: Not on file  Occupational History  . Not on file  Tobacco Use  . Smoking status: Never Smoker  . Smokeless tobacco: Never Used  Substance and Sexual Activity  . Alcohol use: Yes    Alcohol/week: 2.0 standard drinks    Types: 2 Glasses of wine per week  . Drug use: No  . Sexual activity: Not on file  Other Topics Concern  . Not on file  Social History Narrative  . Not on file   Social Determinants of Health   Financial Resource Strain:   . Difficulty of Paying Living Expenses: Not on file  Food Insecurity:   . Worried About Charity fundraiser in the Last Year:  Not on file  . Ran Out of Food in the Last Year: Not on file  Transportation Needs:   . Lack of Transportation (Medical): Not on file  . Lack of Transportation (Non-Medical): Not on file  Physical Activity:   . Days of Exercise per Week: Not on file  . Minutes of Exercise per Session: Not on file  Stress:   . Feeling of Stress : Not on file  Social Connections:   . Frequency of Communication with Friends and Family: Not on file  . Frequency of Social Gatherings with Friends and Family: Not on file  . Attends Religious Services: Not on file  . Active Member of Clubs or Organizations: Not on file  . Attends Archivist Meetings: Not on file  . Marital Status: Not on file     Family History: The patient's family history includes Alcohol abuse in an other family member; Cancer in an other family member; Heart disease in an other family  member; Hypertension in an other family member; Stroke in an other family member. There is no history of Colon cancer, Colon polyps, Rectal cancer, or Stomach cancer.  ROS:   Please see the history of present illness.    All other systems reviewed and are negative.  EKGs/Labs/Other Studies Reviewed:    EKG:  EKG is ordered today.  The ekg ordered today demonstrates sinus bradycardia 53 bpm, right bundle branch block, no change from previous tracings.  Recent Labs: 05/19/2019: ALT 17; BUN 16; Creatinine, Ser 1.17; Hemoglobin 15.4; Platelets 193.0; Potassium 4.3; Sodium 140; TSH 3.50  Recent Lipid Panel    Component Value Date/Time   CHOL 125 05/19/2019 0913   TRIG 81.0 05/19/2019 0913   TRIG 67 06/08/2006 1049   HDL 41.80 05/19/2019 0913   CHOLHDL 3 05/19/2019 0913   VLDL 16.2 05/19/2019 0913   LDLCALC 67 05/19/2019 0913    Physical Exam:    VS:  BP (!) 148/80   Pulse (!) 53   Ht 5' 9.5" (1.765 m)   Wt 185 lb (83.9 kg)   SpO2 99%   BMI 26.93 kg/m     Wt Readings from Last 3 Encounters:  06/13/19 185 lb (83.9 kg)  05/23/19 189 lb (85.7 kg)  05/19/19 185 lb (83.9 kg)     GEN:  Well nourished, well developed in no acute distress HEENT: Normal NECK: No JVD; No carotid bruits LYMPHATICS: No lymphadenopathy CARDIAC: RRR, no murmurs, rubs, gallops RESPIRATORY:  Clear to auscultation without rales, wheezing or rhonchi  ABDOMEN: Soft, non-tender, non-distended MUSCULOSKELETAL:  No edema; No deformity  SKIN: Warm and dry NEUROLOGIC:  Alert and oriented x 3 PSYCHIATRIC:  Normal affect   ASSESSMENT:    1. Coronary artery disease involving native coronary artery of native heart without angina pectoris   2. Essential hypertension   3. Mixed hyperlipidemia    PLAN:    In order of problems listed above:  1. The patient is stable with no angina.  His medical program will be continued without change.  I will see him back in 1 year. 2. Blood pressure is borderline.  On my  recheck it is 140/80.  He will continue on metoprolol 50 mg twice daily.  We discussed a home blood pressure monitor and he will obtain one at his pharmacy.  He knows to contact us if his home readings are greater than 140/80.  In the past his home readings have been in a good range.  If he requires  intensification of his antihypertensive therapy, I would likely add a low-dose of amlodipine and consider reducing his metoprolol dose because of bradycardia and bundle branch block.  Notably, he is completely asymptomatic with respect to his heart rhythm with no complaints of dizziness or lightheadedness. 3. Lipids are at goal with total cholesterol 125, HDL 42, LDL 67, triglycerides 81.  He continues on rosuvastatin.   Medication Adjustments/Labs and Tests Ordered: Current medicines are reviewed at length with the patient today.  Concerns regarding medicines are outlined above.  Orders Placed This Encounter  Procedures  . EKG 12-Lead   No orders of the defined types were placed in this encounter.   Patient Instructions  Medication Instructions:  Your provider recommends that you continue on your current medications as directed. Please refer to the Current Medication list given to you today.   *If you need a refill on your cardiac medications before your next appointment, please call your pharmacy*  Follow-Up: At [facility], you and your health needs are our priority.  As part of our continuing mission to provide you with exceptional heart care, we have created designated Provider Care Teams.  These Care Teams include your primary Cardiologist (physician) and Advanced Practice Providers (APPs -  Physician Assistants and Nurse Practitioners) who all work together to provide you with the care you need, when you need it. Your next appointment:   12 month(s) The format for your next appointment:   In Person Provider:   You may see [name], MD or one of the following Advanced Practice  Providers on your designated Care Team:    Richardson Dopp, PA-C  [place], [place]  Daune Perch, NP  Other Instructions We recommends you purchase an Omron blood pressure cuff. If your blood pressure readings are consistently over 140/80, call us as we may need to adjust your medications.    Signed, [name], MD  06/13/2019 9:17 AM    [place]

## 2019-06-13 NOTE — Patient Instructions (Signed)
Medication Instructions:  Your provider recommends that you continue on your current medications as directed. Please refer to the Current Medication list given to you today.   *If you need a refill on your cardiac medications before your next appointment, please call your pharmacy*  Follow-Up: At [facility], you and your health needs are our priority.  As part of our continuing mission to provide you with exceptional heart care, we have created designated Provider Care Teams.  These Care Teams include your primary Cardiologist (physician) and Advanced Practice Providers (APPs -  Physician Assistants and Nurse Practitioners) who all work together to provide you with the care you need, when you need it. Your next appointment:   12 month(s) The format for your next appointment:   In Person Provider:   You may see [name], MD or one of the following Advanced Practice Providers on your designated Care Team:    Richardson Dopp, PA-C  [place], [place]  Daune Perch, NP  Other Instructions We recommends you purchase an Omron blood pressure cuff. If your blood pressure readings are consistently over 140/80, call us as we may need to adjust your medications.

## 2019-07-24 ENCOUNTER — Ambulatory Visit: Payer: Medicare Other | Attending: Internal Medicine

## 2019-07-24 DIAGNOSIS — Z23 Encounter for immunization: Secondary | ICD-10-CM | POA: Insufficient documentation

## 2019-07-24 NOTE — Progress Notes (Signed)
   Covid-19 Vaccination Clinic  Name:  Kevin Mcbride    MRN: [id number] DOB: Nov 01, 1945  07/24/2019  [name] was observed post Covid-19 immunization for 15 minutes without incidence. He was provided with Vaccine Information Sheet and instruction to access the V-Safe system.   [name] was instructed to call 911 with any severe reactions post vaccine: Marland Kitchen Difficulty breathing  . Swelling of your face and throat  . A fast heartbeat  . A bad rash all over your body  . Dizziness and weakness    Immunizations Administered    Name Date Dose VIS Date Route   Pfizer COVID-19 Vaccine 07/24/2019  6:19 PM 0.3 mL 06/13/2019 Intramuscular   Manufacturer: [place]   Lot: [id number]   [place]: [id number]

## 2019-08-14 ENCOUNTER — Ambulatory Visit: Payer: Medicare Other | Attending: Internal Medicine

## 2019-08-14 DIAGNOSIS — Z23 Encounter for immunization: Secondary | ICD-10-CM | POA: Insufficient documentation

## 2019-08-14 NOTE — Progress Notes (Signed)
   Covid-19 Vaccination Clinic  Name:  Kevin Mcbride    MRN: [id number] DOB: Oct 16, 1945  08/14/2019  Mr. [name] was observed post Covid-19 immunization for 15 minutes without incidence. He was provided with Vaccine Information Sheet and instruction to access the V-Safe system.   Mr. [name] was instructed to call 911 with any severe reactions post vaccine: Marland Kitchen Difficulty breathing  . Swelling of your face and throat  . A fast heartbeat  . A bad rash all over your body  . Dizziness and weakness    Immunizations Administered    Name Date Dose VIS Date Route   Pfizer COVID-19 Vaccine 08/14/2019  1:53 PM 0.3 mL 06/13/2019 Intramuscular   Manufacturer: [place]   Lot: [id number]   [place]: [id number]

## 2020-03-26 DIAGNOSIS — Z23 Encounter for immunization: Secondary | ICD-10-CM | POA: Diagnosis not present

## 2020-04-08 DIAGNOSIS — Z23 Encounter for immunization: Secondary | ICD-10-CM | POA: Diagnosis not present

## 2020-05-20 ENCOUNTER — Ambulatory Visit (INDEPENDENT_AMBULATORY_CARE_PROVIDER_SITE_OTHER): Payer: Medicare Other | Admitting: Family Medicine

## 2020-05-20 ENCOUNTER — Other Ambulatory Visit: Payer: Self-pay

## 2020-05-20 ENCOUNTER — Encounter: Payer: Self-pay | Admitting: Family Medicine

## 2020-05-20 ENCOUNTER — Telehealth: Payer: Self-pay

## 2020-05-20 VITALS — BP 137/72 | HR 66 | Ht 69.5 in | Wt 176.0 lb

## 2020-05-20 DIAGNOSIS — N401 Enlarged prostate with lower urinary tract symptoms: Secondary | ICD-10-CM | POA: Diagnosis not present

## 2020-05-20 DIAGNOSIS — N138 Other obstructive and reflux uropathy: Secondary | ICD-10-CM

## 2020-05-20 DIAGNOSIS — I2510 Atherosclerotic heart disease of native coronary artery without angina pectoris: Secondary | ICD-10-CM | POA: Diagnosis not present

## 2020-05-20 DIAGNOSIS — M1000 Idiopathic gout, unspecified site: Secondary | ICD-10-CM

## 2020-05-20 DIAGNOSIS — E119 Type 2 diabetes mellitus without complications: Secondary | ICD-10-CM | POA: Diagnosis not present

## 2020-05-20 DIAGNOSIS — E785 Hyperlipidemia, unspecified: Secondary | ICD-10-CM | POA: Diagnosis not present

## 2020-05-20 DIAGNOSIS — I10 Essential (primary) hypertension: Secondary | ICD-10-CM

## 2020-05-20 LAB — LIPID PANEL
Cholesterol: 129 mg/dL (ref 0–200)
HDL: 40.2 mg/dL (ref >39.00)
LDL Cholesterol: 75 mg/dL (ref 0–99)
NonHDL: 88.65
Total CHOL/HDL Ratio: 3
Triglycerides: 68 mg/dL (ref 0.0–149.0)
VLDL: 13.6 mg/dL (ref 0.0–40.0)

## 2020-05-20 LAB — BASIC METABOLIC PANEL
BUN: 18 mg/dL (ref 6–23)
CO2: 30 mEq/L (ref 19–32)
Calcium: 9.8 mg/dL (ref 8.4–10.5)
Chloride: 103 mEq/L (ref 96–112)
Creatinine, Ser: 1.22 mg/dL (ref 0.40–1.50)
GFR: 58.27 mL/min — ABNORMAL LOW (ref >60.00)
Glucose, Bld: 97 mg/dL (ref 70–99)
Potassium: 4.6 mEq/L (ref 3.5–5.1)
Sodium: 141 mEq/L (ref 135–145)

## 2020-05-20 LAB — CBC WITH DIFFERENTIAL/PLATELET
Basophils Absolute: 0 10*3/uL (ref 0.0–0.1)
Basophils Relative: 0.8 % (ref 0.0–3.0)
Eosinophils Absolute: 0.2 10*3/uL (ref 0.0–0.7)
Eosinophils Relative: 5 % (ref 0.0–5.0)
HCT: 46.6 % (ref 39.0–52.0)
Hemoglobin: 15.8 g/dL (ref 13.0–17.0)
Lymphocytes Relative: 21.9 % (ref 12.0–46.0)
Lymphs Abs: 1 10*3/uL (ref 0.7–4.0)
MCHC: 33.8 g/dL (ref 30.0–36.0)
MCV: 88.1 fl (ref 78.0–100.0)
Monocytes Absolute: 0.5 10*3/uL (ref 0.1–1.0)
Monocytes Relative: 12.1 % — ABNORMAL HIGH (ref 3.0–12.0)
Neutro Abs: 2.6 10*3/uL (ref 1.4–7.7)
Neutrophils Relative %: 60.2 % (ref 43.0–77.0)
Platelets: 159 10*3/uL (ref 150.0–400.0)
RBC: 5.29 Mil/uL (ref 4.22–5.81)
RDW: 15.1 % (ref 11.5–15.5)
WBC: 4.4 10*3/uL (ref 4.0–10.5)

## 2020-05-20 LAB — TSH: TSH: 2.53 u[IU]/mL (ref 0.35–4.50)

## 2020-05-20 LAB — HEPATIC FUNCTION PANEL
ALT: 13 U/L (ref 0–53)
AST: 19 U/L (ref 0–37)
Albumin: 4.7 g/dL (ref 3.5–5.2)
Alkaline Phosphatase: 43 U/L (ref 39–117)
Bilirubin, Direct: 0.2 mg/dL (ref 0.0–0.3)
Total Bilirubin: 0.9 mg/dL (ref 0.2–1.2)
Total Protein: 7 g/dL (ref 6.0–8.3)

## 2020-05-20 LAB — URIC ACID: Uric Acid, Serum: 5.1 mg/dL (ref 4.0–7.8)

## 2020-05-20 LAB — PSA: PSA: 1.08 ng/mL (ref 0.10–4.00)

## 2020-05-20 LAB — HEMOGLOBIN A1C: Hgb A1c MFr Bld: 5.3 % (ref 4.6–6.5)

## 2020-05-20 MED ORDER — METOPROLOL TARTRATE 50 MG PO TABS: 50.0000 mg | ORAL_TABLET | Freq: Two times a day (BID) | ORAL | 3 refills | Status: DC

## 2020-05-20 MED ORDER — ALLOPURINOL 300 MG PO TABS: 300.0000 mg | ORAL_TABLET | Freq: Every day | ORAL | 3 refills | Status: DC

## 2020-05-20 MED ORDER — ROSUVASTATIN CALCIUM 10 MG PO TABS: 10.0000 mg | ORAL_TABLET | Freq: Every day | ORAL | 3 refills | Status: DC

## 2020-05-20 MED ORDER — NAPROXEN 500 MG PO TBEC: DELAYED_RELEASE_TABLET | ORAL | 3 refills | Status: DC

## 2020-05-20 NOTE — Telephone Encounter (Signed)
Notification from pharmacy that naproxen ex is $180.  Please consider changing regular naproxen.

## 2020-05-20 NOTE — Progress Notes (Signed)
Subjective:    Patient ID: Kevin Mcbride, male    DOB: [birth year], 74 y.o.   MRN: [id number]  HPI Here to follow up on issues. He feels great. His last gout episode was several years ago. His BP is stable. He works out at [employer] 4-5 days a week. He watches his diet closely and he has sharply reduced the amount of carbs he consumes daily. He will see Dr. Burt Knack for his annual visit soon.    Review of Systems  Constitutional: Negative.   HENT: Negative.   Eyes: Negative.   Respiratory: Negative.   Cardiovascular: Negative.   Gastrointestinal: Negative.   Genitourinary: Negative.   Musculoskeletal: Negative.   Skin: Negative.   Neurological: Negative.   Psychiatric/Behavioral: Negative.        Objective:   Physical Exam Constitutional:      General: He is not in acute distress.    Appearance: Normal appearance. He is well-developed. He is not diaphoretic.  HENT:     Head: Normocephalic and atraumatic.     Right Ear: External ear normal.     Left Ear: External ear normal.     Nose: Nose normal.     Mouth/Throat:     Pharynx: No oropharyngeal exudate.  Eyes:     General: No scleral icterus.       Right eye: No discharge.        Left eye: No discharge.     Conjunctiva/sclera: Conjunctivae normal.     Pupils: Pupils are equal, round, and reactive to light.  Neck:     Thyroid: No thyromegaly.     Vascular: No JVD.     Trachea: No tracheal deviation.  Cardiovascular:     Rate and Rhythm: Normal rate and regular rhythm.     Heart sounds: Normal heart sounds. No murmur heard.  No friction rub. No gallop.   Pulmonary:     Effort: Pulmonary effort is normal. No respiratory distress.     Breath sounds: Normal breath sounds. No wheezing or rales.  Chest:     Chest wall: No tenderness.  Abdominal:     General: Bowel sounds are normal. There is no distension.     Palpations: Abdomen is soft. There is no mass.     Tenderness: There is no abdominal tenderness. There is no  guarding or rebound.  Genitourinary:    Penis: Normal. No tenderness.      Testes: Normal.     Prostate: Normal.     Rectum: Normal. Guaiac result negative.  Musculoskeletal:        General: No tenderness. Normal range of motion.     Cervical back: Neck supple.  Lymphadenopathy:     Cervical: No cervical adenopathy.  Skin:    General: Skin is warm and dry.     Coloration: Skin is not pale.     Findings: No erythema or rash.  Neurological:     Mental Status: He is alert and oriented to person, place, and time.     Cranial Nerves: No cranial nerve deficit.     Motor: No abnormal muscle tone.     Coordination: Coordination normal.     Deep Tendon Reflexes: Reflexes are normal and symmetric. Reflexes normal.  Psychiatric:        Behavior: Behavior normal.        Thought Content: Thought content normal.        Judgment: Judgment normal.  Assessment & Plan:  His HTN is stable and the gout is well controlled. We will get fasting labs to check lipids, an A1c, etc.  Alysia Penna, MD

## 2020-05-21 MED ORDER — NAPROXEN 500 MG PO TABS: 500.0000 mg | ORAL_TABLET | Freq: Two times a day (BID) | ORAL | 3 refills | Status: DC

## 2020-05-21 NOTE — Telephone Encounter (Signed)
Patient aware and will pick up new Rx 

## 2020-05-21 NOTE — Telephone Encounter (Signed)
Done

## 2020-05-26 ENCOUNTER — Telehealth: Payer: Self-pay | Admitting: Family Medicine

## 2020-05-26 NOTE — Telephone Encounter (Signed)
Done

## 2020-08-19 ENCOUNTER — Ambulatory Visit: Payer: Medicare Other

## 2020-08-27 DIAGNOSIS — Z20822 Contact with and (suspected) exposure to covid-19: Secondary | ICD-10-CM | POA: Diagnosis not present

## 2020-08-27 DIAGNOSIS — Z03818 Encounter for observation for suspected exposure to other biological agents ruled out: Secondary | ICD-10-CM | POA: Diagnosis not present

## 2020-09-08 ENCOUNTER — Telehealth: Payer: Self-pay | Admitting: Family Medicine

## 2020-09-08 NOTE — Telephone Encounter (Signed)
Left message for patient to call back and schedule Medicare Annual Wellness Visit (AWV) either virtually or in office.   Last AWV no information please schedule at anytime with [facility] Nurse Health Advisor 1 or 2   This should be a 45 minute visit. 

## 2020-12-01 ENCOUNTER — Telehealth: Payer: Self-pay | Admitting: Family Medicine

## 2020-12-01 NOTE — Telephone Encounter (Signed)
Left message for patient to call back and schedule Medicare Annual Wellness Visit (AWV) either virtually or in office.   Last AWV no information please schedule at anytime with [facility] Nurse Health Advisor 1 or 2   This should be a 45 minute visit. 

## 2020-12-01 NOTE — Telephone Encounter (Signed)
Pt needs AWV-I PER PALMETTO 07/04/11

## 2021-01-13 ENCOUNTER — Telehealth: Payer: Self-pay | Admitting: Family Medicine

## 2021-01-13 NOTE — Telephone Encounter (Signed)
Left message for patient to call back and schedule Medicare Annual Wellness Visit (AWV) either virtually or in office.   AWV-I PER PAL[place] 07/04/11 please schedule at anytime with [facility] Nurse Health Advisor 1 or 2   This should be a 45 minute visit.

## 2021-02-09 ENCOUNTER — Telehealth: Payer: Self-pay | Admitting: Family Medicine

## 2021-02-09 NOTE — Telephone Encounter (Signed)
Left message for patient to call back and schedule Medicare Annual Wellness Visit (AWV) either virtually or in office.  Left both  my jabber number [phone] and office number    AWV-I PER PALMETTO 07/04/11 please schedule at anytime with [facility] Nurse Health Advisor 1 or 2   This should be a 45 minute visit.

## 2021-03-08 ENCOUNTER — Encounter: Payer: Self-pay | Admitting: Gastroenterology

## 2021-05-05 ENCOUNTER — Other Ambulatory Visit: Payer: Self-pay | Admitting: Family Medicine

## 2021-05-05 DIAGNOSIS — I2510 Atherosclerotic heart disease of native coronary artery without angina pectoris: Secondary | ICD-10-CM

## 2021-05-05 DIAGNOSIS — E785 Hyperlipidemia, unspecified: Secondary | ICD-10-CM

## 2021-05-05 DIAGNOSIS — I10 Essential (primary) hypertension: Secondary | ICD-10-CM

## 2021-05-23 ENCOUNTER — Encounter: Payer: Medicare Other | Admitting: Family Medicine

## 2021-06-13 ENCOUNTER — Encounter: Payer: Self-pay | Admitting: Cardiovascular Disease

## 2021-06-20 ENCOUNTER — Encounter: Payer: Self-pay | Admitting: Family Medicine

## 2021-06-20 ENCOUNTER — Ambulatory Visit (INDEPENDENT_AMBULATORY_CARE_PROVIDER_SITE_OTHER): Payer: Medicare Other | Admitting: Family Medicine

## 2021-06-20 VITALS — BP 132/78 | HR 53 | Temp 97.7°F | Ht 69.5 in | Wt 179.6 lb

## 2021-06-20 DIAGNOSIS — E119 Type 2 diabetes mellitus without complications: Secondary | ICD-10-CM | POA: Diagnosis not present

## 2021-06-20 DIAGNOSIS — N401 Enlarged prostate with lower urinary tract symptoms: Secondary | ICD-10-CM | POA: Diagnosis not present

## 2021-06-20 DIAGNOSIS — N138 Other obstructive and reflux uropathy: Secondary | ICD-10-CM

## 2021-06-20 DIAGNOSIS — I10 Essential (primary) hypertension: Secondary | ICD-10-CM

## 2021-06-20 DIAGNOSIS — M1000 Idiopathic gout, unspecified site: Secondary | ICD-10-CM | POA: Diagnosis not present

## 2021-06-20 DIAGNOSIS — E782 Mixed hyperlipidemia: Secondary | ICD-10-CM

## 2021-06-20 DIAGNOSIS — I2510 Atherosclerotic heart disease of native coronary artery without angina pectoris: Secondary | ICD-10-CM | POA: Diagnosis not present

## 2021-06-20 DIAGNOSIS — E785 Hyperlipidemia, unspecified: Secondary | ICD-10-CM

## 2021-06-20 DIAGNOSIS — L80 Vitiligo: Secondary | ICD-10-CM | POA: Insufficient documentation

## 2021-06-20 LAB — CBC WITH DIFFERENTIAL/PLATELET
Basophils Absolute: 0 10*3/uL (ref 0.0–0.1)
Basophils Relative: 0.5 % (ref 0.0–3.0)
Eosinophils Absolute: 0.2 10*3/uL (ref 0.0–0.7)
Eosinophils Relative: 3.2 % (ref 0.0–5.0)
HCT: 45.7 % (ref 39.0–52.0)
Hemoglobin: 15.7 g/dL (ref 13.0–17.0)
Lymphocytes Relative: 20.8 % (ref 12.0–46.0)
Lymphs Abs: 1.3 10*3/uL (ref 0.7–4.0)
MCHC: 34.3 g/dL (ref 30.0–36.0)
MCV: 88.6 fl (ref 78.0–100.0)
Monocytes Absolute: 0.6 10*3/uL (ref 0.1–1.0)
Monocytes Relative: 9.7 % (ref 3.0–12.0)
Neutro Abs: 4 10*3/uL (ref 1.4–7.7)
Neutrophils Relative %: 65.8 % (ref 43.0–77.0)
Platelets: 169 10*3/uL (ref 150.0–400.0)
RBC: 5.16 Mil/uL (ref 4.22–5.81)
RDW: 15 % (ref 11.5–15.5)
WBC: 6.1 10*3/uL (ref 4.0–10.5)

## 2021-06-20 LAB — LIPID PANEL
Cholesterol: 117 mg/dL (ref 0–200)
HDL: 42.9 mg/dL (ref >39.00)
LDL Cholesterol: 54 mg/dL (ref 0–99)
NonHDL: 73.73
Total CHOL/HDL Ratio: 3
Triglycerides: 99 mg/dL (ref 0.0–149.0)
VLDL: 19.8 mg/dL (ref 0.0–40.0)

## 2021-06-20 LAB — HEPATIC FUNCTION PANEL
ALT: 18 U/L (ref 0–53)
AST: 23 U/L (ref 0–37)
Albumin: 4.5 g/dL (ref 3.5–5.2)
Alkaline Phosphatase: 53 U/L (ref 39–117)
Bilirubin, Direct: 0.2 mg/dL (ref 0.0–0.3)
Total Bilirubin: 1 mg/dL (ref 0.2–1.2)
Total Protein: 7.1 g/dL (ref 6.0–8.3)

## 2021-06-20 LAB — BASIC METABOLIC PANEL
BUN: 18 mg/dL (ref 6–23)
CO2: 30 mEq/L (ref 19–32)
Calcium: 10 mg/dL (ref 8.4–10.5)
Chloride: 102 mEq/L (ref 96–112)
Creatinine, Ser: 1.19 mg/dL (ref 0.40–1.50)
GFR: 59.58 mL/min — ABNORMAL LOW (ref >60.00)
Glucose, Bld: 106 mg/dL — ABNORMAL HIGH (ref 70–99)
Potassium: 4.3 mEq/L (ref 3.5–5.1)
Sodium: 140 mEq/L (ref 135–145)

## 2021-06-20 LAB — URIC ACID: Uric Acid, Serum: 5.5 mg/dL (ref 4.0–7.8)

## 2021-06-20 LAB — TSH: TSH: 3 u[IU]/mL (ref 0.35–5.50)

## 2021-06-20 LAB — HEMOGLOBIN A1C: Hgb A1c MFr Bld: 5.3 % (ref 4.6–6.5)

## 2021-06-20 LAB — PSA: PSA: 3.16 ng/mL (ref 0.10–4.00)

## 2021-06-20 MED ORDER — NAPROXEN 500 MG PO TABS: 500.0000 mg | ORAL_TABLET | Freq: Two times a day (BID) | ORAL | 3 refills | Status: DC

## 2021-06-20 MED ORDER — ROSUVASTATIN CALCIUM 10 MG PO TABS: 10.0000 mg | ORAL_TABLET | Freq: Every day | ORAL | 3 refills | Status: DC

## 2021-06-20 MED ORDER — METOPROLOL TARTRATE 50 MG PO TABS: 50.0000 mg | ORAL_TABLET | Freq: Two times a day (BID) | ORAL | 3 refills | Status: DC

## 2021-06-20 MED ORDER — ALLOPURINOL 300 MG PO TABS
300.0000 mg | ORAL_TABLET | Freq: Every day | ORAL | 3 refills | Status: DC
Start: 2021-06-20 — End: 2022-06-21

## 2021-06-20 NOTE — Progress Notes (Signed)
Subjective:    Patient ID: Kevin Mcbride, male    DOB: 1946/05/13, 75 y.o.   MRN: [id number]  HPI Here to follow up on issues. He feels fine. He exercises almost every day. He and his family will be going on a cruise top the [place] in a few weeks. He has not had a gout flare for years. His BP is stable.    Review of Systems  Constitutional: Negative.   HENT: Negative.    Eyes: Negative.   Respiratory: Negative.    Cardiovascular: Negative.   Gastrointestinal: Negative.   Genitourinary: Negative.   Musculoskeletal: Negative.   Skin: Negative.   Neurological: Negative.   Psychiatric/Behavioral: Negative.        Objective:   Physical Exam Constitutional:      General: He is not in acute distress.    Appearance: Normal appearance. He is well-developed. He is not diaphoretic.  HENT:     Head: Normocephalic and atraumatic.     Right Ear: External ear normal.     Left Ear: External ear normal.     Nose: Nose normal.     Mouth/Throat:     Pharynx: No oropharyngeal exudate.  Eyes:     General: No scleral icterus.       Right eye: No discharge.        Left eye: No discharge.     Conjunctiva/sclera: Conjunctivae normal.     Pupils: Pupils are equal, round, and reactive to light.  Neck:     Thyroid: No thyromegaly.     Vascular: No JVD.     Trachea: No tracheal deviation.  Cardiovascular:     Rate and Rhythm: Normal rate and regular rhythm.     Heart sounds: Normal heart sounds. No murmur heard.   No friction rub. No gallop.  Pulmonary:     Effort: Pulmonary effort is normal. No respiratory distress.     Breath sounds: Normal breath sounds. No wheezing or rales.  Chest:     Chest wall: No tenderness.  Abdominal:     General: Bowel sounds are normal. There is no distension.     Palpations: Abdomen is soft. There is no mass.     Tenderness: There is no abdominal tenderness. There is no guarding or rebound.  Genitourinary:    Penis: Normal. No tenderness.       Testes: Normal.     Prostate: Normal.     Rectum: Normal. Guaiac result negative.  Musculoskeletal:        General: No tenderness. Normal range of motion.     Cervical back: Neck supple.  Lymphadenopathy:     Cervical: No cervical adenopathy.  Skin:    General: Skin is warm and dry.     Coloration: Skin is not pale.     Findings: No erythema or rash.  Neurological:     Mental Status: He is alert and oriented to person, place, and time.     Cranial Nerves: No cranial nerve deficit.     Motor: No abnormal muscle tone.     Coordination: Coordination normal.     Deep Tendon Reflexes: Reflexes are normal and symmetric. Reflexes normal.  Psychiatric:        Behavior: Behavior normal.        Thought Content: Thought content normal.        Judgment: Judgment normal.          Assessment & Plan:  He is doing well. His HTN and  CAD and gout are well controlled. We will get fasting labs to check an A1c, lipids, etc. We spent a total of (   ) minutes reviewing records and discussing these issues.  Alysia Penna, MD

## 2021-07-06 ENCOUNTER — Ambulatory Visit: Payer: Medicare Other | Admitting: Cardiovascular Disease

## 2021-07-14 ENCOUNTER — Telehealth: Payer: Self-pay | Admitting: Family Medicine

## 2021-07-14 NOTE — Telephone Encounter (Signed)
Left message for patient to call back and schedule Medicare Annual Wellness Visit (AWV) either virtually or in office. Left  my Herbie Drape number [phone]   AWV-I PER PALMETTO 07/04/11    please schedule at anytime with [facility] Nurse Health Advisor 1 or 2   This should be a 45 minute visit.

## 2021-08-12 ENCOUNTER — Ambulatory Visit: Payer: Medicare Other | Admitting: Cardiovascular Disease

## 2021-08-29 ENCOUNTER — Telehealth: Payer: Self-pay | Admitting: Family Medicine

## 2021-08-29 NOTE — Telephone Encounter (Signed)
Left message for patient to call back and schedule Medicare Annual Wellness Visit (AWV) either virtually or in office. Left  my Herbie Drape number [phone]   AWV-I PER PALMETTO 07/04/11    ; please schedule at anytime with [facility] Nurse Health Advisor 1 or 2   This should be a 45 minute visit.

## 2021-09-23 ENCOUNTER — Encounter: Payer: Self-pay | Admitting: Cardiovascular Disease

## 2021-09-28 ENCOUNTER — Encounter: Payer: Self-pay | Admitting: Cardiovascular Disease

## 2021-09-28 ENCOUNTER — Other Ambulatory Visit: Payer: Self-pay

## 2021-09-28 ENCOUNTER — Ambulatory Visit: Payer: Medicare Other | Admitting: Cardiovascular Disease

## 2021-09-28 VITALS — BP 130/82 | HR 54 | Ht 70.0 in | Wt 183.8 lb

## 2021-09-28 DIAGNOSIS — I2510 Atherosclerotic heart disease of native coronary artery without angina pectoris: Secondary | ICD-10-CM | POA: Diagnosis not present

## 2021-09-28 DIAGNOSIS — E782 Mixed hyperlipidemia: Secondary | ICD-10-CM | POA: Diagnosis not present

## 2021-09-28 NOTE — Progress Notes (Signed)
?Cardiology Office Note:   ? ?Date:  09/28/2021  ? ?ID:  Kevin Mcbride, DOB 1945-11-11, MRN [id number] ? ?PCP:  [name], MD ?  ?[facility] Providers ?Cardiologist:  [name], MD    ? ?Referring MD: [name], MD  ? ?Chief Complaint  ?Patient presents with  ? Coronary Artery Disease  ? ? ?History of Present Illness:   ? ?Kevin Mcbride is a 76 y.o. male with a hx of  coronary artery disease.  The patient has multivessel CAD and underwent CABG in 2007, treated with a LIMA to LAD, saphenous vein graft to diagonal, and saphenous vein graft to obtuse marginal.  He has had normal left ventricular function. ? ?The patient is here alone today.  He is doing very well with no cardiac-related symptoms.  He exercises regularly with no exertional complaints. Today, he denies symptoms of palpitations, chest pain, shortness of breath, orthopnea, PND, lower extremity edema, dizziness, or syncope. ? ? ?Past Medical History:  ?Diagnosis Date  ? CAD (coronary artery disease)   ? post CABG.  RBB. no symptomatic concerns, sees Dr. [name]   ? Diabetes mellitus   ? one time elevated blood sugar  ? DJD (degenerative joint disease)   ? neck  ? ED (erectile dysfunction)   ? Gout   ? Hyperlipidemia   ? Hypertension   ? Myocardial infarct ([place]) 07/03/06  ? Vitiligo   ? sees Dr. [name]   ? ? ?Past Surgical History:  ?Procedure Laterality Date  ? cardiac bypass  2009  ? triple  ? COLONOSCOPY  03/23/2016  ? per Dr. Fuller Plan,  adenomatous polyp, repeat in 7 yrs  ? ? ?Current Medications: ?Current Meds  ?Medication Sig  ? allopurinol (ZYLOPRIM) 300 MG tablet Take 1 tablet (300 mg total) by mouth daily.  ? aspirin 81 MG tablet Take 81 mg by mouth daily.    ? metoprolol tartrate (LOPRESSOR) 50 MG tablet Take 1 tablet (50 mg total) by mouth 2 (two) times daily.  ? Multiple Vitamins-Minerals (CENTRUM PO) Take 1 tablet by mouth daily.   ? naproxen (NAPROSYN) 500 MG tablet Take 1 tablet (500 mg total) by mouth 2 (two)  times daily with a meal.  ? rosuvastatin (CRESTOR) 10 MG tablet Take 1 tablet (10 mg total) by mouth daily.  ? ?Current Facility-Administered Medications for the 09/28/21 encounter (Office Visit) with [name], MD  ?Medication  ? 0.9 %  sodium chloride infusion  ?  ? ?Allergies:   Patient has no known allergies.  ? ?Social History  ? ?Socioeconomic History  ? Marital status: Married  ?  Spouse name: Not on file  ? Number of children: Not on file  ? Years of education: Not on file  ? Highest education level: Not on file  ?Occupational History  ? Not on file  ?Tobacco Use  ? Smoking status: Never  ? Smokeless tobacco: Never  ?Substance and Sexual Activity  ? Alcohol use: Yes  ?  Alcohol/week: 2.0 standard drinks  ?  Types: 2 Glasses of wine per week  ? Drug use: No  ? Sexual activity: Not on file  ?Other Topics Concern  ? Not on file  ?Social History Narrative  ? Not on file  ? ?Social Determinants of Health  ? ?Financial Resource Strain: Not on file  ?Food Insecurity: Not on file  ?Transportation Needs: Not on file  ?Physical Activity: Not on file  ?Stress: Not on file  ?Social  Connections: Not on file  ?  ? ?Family History: ?The patient's family history includes Alcohol abuse in an other family member; Cancer in an other family member; Heart disease in an other family member; Hypertension in an other family member; Stroke in an other family member. There is no history of Colon cancer, Colon polyps, Rectal cancer, or Stomach cancer. ? ?ROS:   ?Please see the history of present illness.    ?All other systems reviewed and are negative. ? ?EKGs/Labs/Other Studies Reviewed:   ? ?EKG:  EKG is ordered today.  The ekg ordered today demonstrates sinus brady 54 bpm, RBBB, no significant change from prior ? ?Recent Labs: ?06/20/2021: ALT 18; BUN 18; Creatinine, Ser 1.19; Hemoglobin 15.7; Platelets 169.0; Potassium 4.3; Sodium 140; TSH 3.00  ?Recent Lipid Panel ?   ?Component Value Date/Time  ? CHOL 117 06/20/2021 0915  ?  TRIG 99.0 06/20/2021 0915  ? TRIG 67 06/08/2006 1049  ? HDL 42.90 06/20/2021 0915  ? CHOLHDL 3 06/20/2021 0915  ? VLDL 19.8 06/20/2021 0915  ? [place] 54 06/20/2021 0915  ? ? ? ?Risk Assessment/Calculations:   ?  ? ?    ? ?Physical Exam:   ? ?VS:  BP 130/82   Pulse (!) 54   Ht '5\' 10"'$  (1.778 m)   Wt 183 lb 12.8 oz (83.4 kg)   SpO2 99%   BMI 26.37 kg/m?    ? ?Wt Readings from Last 3 Encounters:  ?09/28/21 183 lb 12.8 oz (83.4 kg)  ?06/20/21 179 lb 9.6 oz (81.5 kg)  ?05/20/20 176 lb (79.8 kg)  ?  ? ?GEN:  Well nourished, well developed in no acute distress ?HEENT: Normal ?NECK: No JVD; No carotid bruits ?LYMPHATICS: No lymphadenopathy ?CARDIAC: RRR, no murmurs, rubs, gallops ?RESPIRATORY:  Clear to auscultation without rales, wheezing or rhonchi  ?ABDOMEN: Soft, non-tender, non-distended ?MUSCULOSKELETAL:  No edema; No deformity  ?SKIN: Warm and dry ?NEUROLOGIC:  Alert and oriented x 3 ?PSYCHIATRIC:  Normal affect  ? ?ASSESSMENT:   ? ?1. Coronary artery disease involving native coronary artery of native heart without angina pectoris   ?2. Mixed hyperlipidemia   ? ?PLAN:   ? ?In order of problems listed above: ? ?The patient continues to do very well from a cardiac perspective.  He is taking good care of himself.  I trended his weights over time and he is maintaining a good weight, down about 10 pounds from where he had been over the previous several years.  His medications include aspirin, beta-blocker, and a statin drug.  I did not make any changes today.  I will see him back in 1 year for follow-up evaluation. ?Total cholesterol 117, LDL cholesterol 54 from labs in December 2022.  Transaminases are normal.  No medication changes today. ? ?   ?Medication Adjustments/Labs and Tests Ordered: ?Current medicines are reviewed at length with the patient today.  Concerns regarding medicines are outlined above.  ?Orders Placed This Encounter  ?Procedures  ? EKG 12-Lead  ? ?No orders of the defined types were placed in  this encounter. ? ? ?Patient Instructions  ?Medication Instructions:  ?Your physician recommends that you continue on your current medications as directed. Please refer to the Current Medication list given to you today. ? ?*If you need a refill on your cardiac medications before your next appointment, please call your pharmacy* ? ? ?Lab Work: ?NONE ?If you have labs (blood work) drawn today and your tests are completely normal, you will receive your results  only by: ?MyChart Message (if you have MyChart) OR ?A paper copy in the mail ?If you have any lab test that is abnormal or we need to change your treatment, we will call you to review the results. ? ? ?Testing/Procedures: ?NONE ? ? ?Follow-Up: ?At [facility], you and your health needs are our priority.  As part of our continuing mission to provide you with exceptional heart care, we have created designated Provider Care Teams.  These Care Teams include your primary Cardiologist (physician) and Advanced Practice Providers (APPs -  Physician Assistants and Nurse Practitioners) who all work together to provide you with the care you need, when you need it. ? ?Your next appointment:   ?1 year(s) ? ?The format for your next appointment:   ?In Person ? ?Provider:   ?[name], MD   ? ?   ? ?Signed, ?[name], MD  ?09/28/2021 10:38 AM    ?[place] ?

## 2021-09-28 NOTE — Patient Instructions (Signed)
Medication Instructions:  ?Your physician recommends that you continue on your current medications as directed. Please refer to the Current Medication list given to you today. ? ?*If you need a refill on your cardiac medications before your next appointment, please call your pharmacy* ? ? ?Lab Work: ?NONE ?If you have labs (blood work) drawn today and your tests are completely normal, you will receive your results only by: ?MyChart Message (if you have MyChart) OR ?A paper copy in the mail ?If you have any lab test that is abnormal or we need to change your treatment, we will call you to review the results. ? ? ?Testing/Procedures: ?NONE ? ? ?Follow-Up: ?At [facility] [place], you and your health needs are our priority.  As part of our continuing mission to provide you with exceptional heart care, we have created designated Provider Care Teams.  These Care Teams include your primary Cardiologist (physician) and Advanced Practice Providers (APPs -  Physician Assistants and Nurse Practitioners) who all work together to provide you with the care you need, when you need it. ? ?Your next appointment:   ?1 year(s) ? ?The format for your next appointment:   ?In Person ? ?Provider:   ?[name], MD   ? ?  ?

## 2021-10-05 ENCOUNTER — Telehealth: Payer: Self-pay | Admitting: Family Medicine

## 2021-10-05 NOTE — Telephone Encounter (Signed)
Spoke with patient to schedule Medicare Annual Wellness Visit (AWV) either virtually or in office.  ? ?Patient wanted a call back next week ? ? AWV-I PER PALMET[place] 07/04/11   ?please schedule at anytime with [facility] Nurse Health Advisor 1 or 2 ? ? ?This should be a 45 minute visit.  ?

## 2021-10-10 ENCOUNTER — Telehealth: Payer: Self-pay | Admitting: Family Medicine

## 2021-10-10 NOTE — Telephone Encounter (Signed)
Left message for patient to call back and schedule Medicare Annual Wellness Visit (AWV) either virtually or in office. Left  my jabber number [phone] ? ? ?  ? AWV-I PER PALMETTO 07/04/11  ? please schedule at anytime with [facility] Nurse Health Advisor 1 or 2 ? ? ?This should be a 45 minute visit.  ?

## 2021-11-03 ENCOUNTER — Telehealth: Payer: Self-pay | Admitting: Family Medicine

## 2021-11-03 NOTE — Telephone Encounter (Signed)
Left message for patient to call back and schedule Medicare Annual Wellness Visit (AWV) either virtually or in office. Left  my jabber number [phone] ? ? ? AWV-I PER PALMETTO 07/04/11   ?; please schedule at anytime with [facility] Nurse Health Advisor 1 or 2 ? ? ?This should be a 45 minute visit.  ?

## 2021-11-04 ENCOUNTER — Ambulatory Visit (INDEPENDENT_AMBULATORY_CARE_PROVIDER_SITE_OTHER): Payer: Medicare Other

## 2021-11-04 VITALS — Ht 70.0 in | Wt 172.0 lb

## 2021-11-04 DIAGNOSIS — Z0000 Encounter for general adult medical examination without abnormal findings: Secondary | ICD-10-CM

## 2021-11-04 NOTE — Progress Notes (Signed)
? ?Subjective:  ? Kevin Mcbride is a 76 y.o. male who presents for Medicare Annual/Subsequent preventive examination. ? ?Review of Systems    ?Virtual Visit via Telephone Note ? ?I connected with  Kevin Mcbride on 11/04/21 at  3:00 PM EDT by telephone and verified that I am speaking with the correct person using two identifiers. ? ?Location: ?Patient: Home ?Provider: Office ?Persons participating in the virtual visit: patient/Nurse Health Advisor ?  ?I discussed the limitations, risks, security and privacy concerns of performing an evaluation and management service by telephone and the availability of in person appointments. The patient expressed understanding and agreed to proceed. ? ?Interactive audio and video telecommunications were attempted between this nurse and patient, however failed, due to patient having technical difficulties OR patient did not have access to video capability.  We continued and completed visit with audio only. ? ?Some vital signs may be absent or patient reported.  ? ?Criselda Peaches, LPN  ?Cardiac Risk Factors include: advanced age (>61mn, >>49women);hypertension;male gender ? ?   ?Objective:  ?  ?Today's Vitals  ? 11/04/21 1507  ?Weight: 172 lb (78 kg)  ?Height: '5\' 10"'$  (1.778 m)  ? ?Body mass index is 24.68 kg/m?. ? ? ?  11/04/2021  ?  3:17 PM 03/23/2016  ? 10:17 AM  ?Advanced Directives  ?Does Patient Have a Medical Advance Directive? Yes No  ?Type of AParamed[place]of [place] will   ?Does patient want to make changes to medical advance directive? No - Patient declined   ?[place]? No - copy requested   ?Would patient like information on creating a medical advance directive?  No - patient declined information  ? ? ?Current Medications (verified) ?Outpatient Encounter Medications as of 11/04/2021  ?Medication Sig  ? allopurinol (ZYLOPRIM) 300 MG tablet Take 1 tablet (300 mg total) by mouth daily.  ? aspirin 81 MG  tablet Take 81 mg by mouth daily.    ? metoprolol tartrate (LOPRESSOR) 50 MG tablet Take 1 tablet (50 mg total) by mouth 2 (two) times daily.  ? Multiple Vitamins-Minerals (CENTRUM PO) Take 1 tablet by mouth daily.   ? naproxen (NAPROSYN) 500 MG tablet Take 1 tablet (500 mg total) by mouth 2 (two) times daily with a meal.  ? rosuvastatin (CRESTOR) 10 MG tablet Take 1 tablet (10 mg total) by mouth daily.  ? ?Facility-Administered Encounter Medications as of 11/04/2021  ?Medication  ? 0.9 %  sodium chloride infusion  ? ? ?Allergies (verified) ?Patient has no known allergies.  ? ?History: ?Past Medical History:  ?Diagnosis Date  ? CAD (coronary artery disease)   ? post CABG.  RBB. no symptomatic concerns, sees Dr. MSherren Mocha  ? Diabetes mellitus   ? one time elevated blood sugar  ? DJD (degenerative joint disease)   ? neck  ? ED (erectile dysfunction)   ? Gout   ? Hyperlipidemia   ? Hypertension   ? [place]ocardial [place]  ? Vitiligo   ? sees Dr. SPaulette Blanch  ? ?Past Surgical History:  ?Procedure Laterality Date  ? cardiac bypass  2009  ? triple  ? COLONOSCOPY  03/23/2016  ? per Dr. SFuller Plan  adenomatous polyp, repeat in 7 yrs  ? ?Family History  ?Problem Relation Age of Onset  ? Alcohol abuse Other   ? Hypertension Other   ? Cancer Other   ?     lung  ? Stroke  Other   ? Heart disease Other   ? Colon cancer Neg Hx   ? Colon polyps Neg Hx   ? Rectal cancer Neg Hx   ? Stomach cancer Neg Hx   ? ?Social History  ? ?Socioeconomic History  ? Marital status: Married  ?  Spouse name: Not on file  ? Number of children: Not on file  ? Years of education: Not on file  ? Highest education level: Not on file  ?Occupational History  ? Not on file  ?Tobacco Use  ? Smoking status: Never  ? Smokeless tobacco: Never  ?Substance and Sexual Activity  ? Alcohol use: Yes  ?  Alcohol/week: 2.0 standard drinks  ?  Types: 2 Glasses of wine per week  ? Drug use: No  ? Sexual activity: Not on file  ?Other Topics Concern  ? Not on  file  ?Social History Narrative  ? Not on file  ? ?Social Determinants of Health  ? ?Financial Resource Strain: Low Risk   ? Difficulty of Paying Living Expenses: Not hard at all  ?Food Insecurity: No Food Insecurity  ? Worried About Charity fundraiser in the Last Year: Never true  ? Ran Out of Food in the Last Year: Never true  ?Transportation Needs: No Transportation Needs  ? Lack of Transportation (Medical): No  ? Lack of Transportation (Non-Medical): No  ?Physical Activity: Sufficiently Active  ? Days of Exercise per Week: 5 days  ? Minutes of Exercise per Session: 130 min  ?Stress: No Stress Concern Present  ? Feeling of Stress : Not at all  ?Social Connections: Socially Integrated  ? Frequency of Communication with Friends and Family: More than three times a week  ? Frequency of Social Gatherings with Friends and Family: More than three times a week  ? Attends Religious Services: More than 4 times per year  ? Active Member of Clubs or Organizations: Yes  ? Attends Archivist Meetings: More than 4 times per year  ? Marital Status: Married  ? ? ?Tobacco Counseling ?Counseling given: Not Answered ? ? ?Clinical Intake: ? ? ?Diabetic?  No ? ?Activities of Daily Living ? ?  11/04/2021  ?  3:16 PM 06/20/2021  ?  2:31 PM  ?In your present state of health, do you have any difficulty performing the following activities:  ?Hearing? 0 0  ?Vision? 0 0  ?Difficulty concentrating or making decisions? 0 0  ?Walking or climbing stairs? 0 0  ?Dressing or bathing? 0 0  ?Doing errands, shopping? 0 0  ?Preparing Food and eating ? N   ?Using the Toilet? N   ?In the past six months, have you accidently leaked urine? N   ?Do you have problems with loss of bowel control? N   ?Managing your Medications? N   ?Managing your Finances? N   ?Housekeeping or managing your Housekeeping? N   ? ? ?Patient Care Team: ?[name], MD as PCP - General ?[name], MD as PCP - Cardiology (Cardiology) ? ?Indicate any recent Medical  Services you may have received from other than [facility] providers in the past year (date may be approximate). ? ?   ?Assessment:  ? This is a routine wellness examination for [place]. ? ?Hearing/Vision screen ?Hearing Screening - Comments:: No hearing difficulty ?Vision Screening - Comments:: Wears glasses. ? ?Dietary issues and exercise activities discussed: ?Exercise limited by: None identified ? ? Goals Addressed   ? ?  ?  ?  ?  ?  ?  This Visit's Progress  ?   Increase physical activity (pt-stated)     ? ?  ? ?Depression Screen ? ?  11/04/2021  ?  3:14 PM 06/20/2021  ?  2:30 PM 06/20/2021  ?  8:46 AM 05/13/2018  ?  8:28 AM 04/04/2016  ?  8:54 AM 04/02/2015  ? 10:29 AM 03/30/2014  ? 10:54 AM  ?PHQ 2/9 Scores  ?PHQ - 2 Score 0 2 0 0 0 0 0  ?PHQ- 9 Score  6 2      ?  ?Fall Risk ? ?  11/04/2021  ?  3:17 PM 06/20/2021  ?  2:29 PM 06/20/2021  ?  8:46 AM 05/13/2018  ?  8:28 AM 04/04/2016  ?  8:54 AM  ?Fall Risk   ?Falls in the past year? 0 1 1 0 No  ?Number falls in past yr: 0 0 1    ?Injury with Fall? 0 0 0    ?Risk for fall due to : No Fall Risks      ?Follow up  Falls evaluation completed     ? ? ?FALL RISK PREVENTION PERTAINING TO THE HOME: ? ?Any stairs in or around the home? Yes  ?If so, are there any without handrails? No  ?Home free of loose throw rugs in walkways, pet beds, electrical cords, etc? Yes  ?Adequate lighting in your home to reduce risk of falls? Yes  ? ?ASSISTIVE DEVICES UTILIZED TO PREVENT FALLS: ? ?Life alert? No  ?Use of a cane, walker or w/c? No  ?Grab bars in the bathroom? No  ?Shower chair or bench in shower? No  ?Elevated toilet seat or a handicapped toilet? No  ? ?TIMED UP AND GO: ? ?Was the test performed? No . Audio Visit ? ?Cognitive Function: ?  ? Immunizations ?Immunization History  ?Administered Date(s) Administered  ? Influenza Whole 05/10/2007, 06/02/2009  ? Influenza, High Dose Seasonal PF 05/02/2017, 03/25/2018, 03/31/2019  ? Influenza-Unspecified 04/02/2014, 03/22/2015, 03/20/2016, 03/26/2020,  04/23/2021  ? PFIZER(Purple Top)SARS-COV-2 Vaccination 07/24/2019, 08/14/2019  ? Pneumococcal Conjugate-13 04/02/2014  ? Pneumococcal Polysaccharide-23 05/09/2017  ? Zoster Recombinat (Shingrix) 05/03/19

## 2021-11-04 NOTE — Patient Instructions (Addendum)
?Mr. Kevin Mcbride , ?Thank you for taking time to come for your Medicare Wellness Visit. I appreciate your ongoing commitment to your health goals. Please review the following plan we discussed and let me know if I can assist you in the future.  ? ?These are the goals we discussed: ? Goals   ? ?   Increase physical activity (pt-stated)   ? ?  ?  ?This is a list of the screening recommended for you and due dates:  ?Health Maintenance  ?Topic Date Due  ? Complete foot exam   Never done  ? Eye exam for diabetics  Never done  ? COVID-19 Vaccine (3 - Booster for Pfizer series) 11/20/2021*  ? Urine Protein Check  12/05/2021*  ? Zoster (Shingles) Vaccine (2 of 2) 02/04/2022*  ? Colon Cancer Screening  11/05/2022*  ? Tetanus Vaccine  11/05/2022*  ? Hepatitis C Screening: USPSTF Recommendation to screen - Ages 18-79 yo.  11/05/2022*  ? Hemoglobin A1C  12/19/2021  ? Flu Shot  01/31/2022  ? Pneumonia Vaccine  Completed  ? HPV Vaccine  Aged Out  ?*Topic was postponed. The date shown is not the original due date.  ?  ? ?Advanced directives: Yes Patient will submit copy ? ?Conditions/risks identified: None ? ?Next appointment: Follow up in one year for your annual wellness visit.  ? ?Preventive Care 33 Years and Older, Male ?Preventive care refers to lifestyle choices and visits with your health care provider that can promote health and wellness. ?What does preventive care include? ?A yearly physical exam. This is also called an annual well check. ?Dental exams once or twice a year. ?Routine eye exams. Ask your health care provider how often you should have your eyes checked. ?Personal lifestyle choices, including: ?Daily care of your teeth and gums. ?Regular physical activity. ?Eating a healthy diet. ?Avoiding tobacco and drug use. ?Limiting alcohol use. ?Practicing safe sex. ?Taking low doses of aspirin every day. ?Taking vitamin and mineral supplements as recommended by your health care provider. ?What happens during an annual  well check? ?The services and screenings done by your health care provider during your annual well check will depend on your age, overall health, lifestyle risk factors, and family history of disease. ?Counseling  ?Your health care provider may ask you questions about your: ?Alcohol use. ?Tobacco use. ?Drug use. ?Emotional well-being. ?Home and relationship well-being. ?Sexual activity. ?Eating habits. ?History of falls. ?Memory and ability to understand (cognition). ?Work and work Statistician. ?Screening  ?You may have the following tests or measurements: ?Height, weight, and BMI. ?Blood pressure. ?Lipid and cholesterol levels. These may be checked every 5 years, or more frequently if you are over 94 years old. ?Skin check. ?Lung cancer screening. You may have this screening every year starting at age 107 if you have a 30-pack-year history of smoking and currently smoke or have quit within the past 15 years. ?Fecal occult blood test (FOBT) of the stool. You may have this test every year starting at age 34. ?Flexible sigmoidoscopy or colonoscopy. You may have a sigmoidoscopy every 5 years or a colonoscopy every 10 years starting at age 72. ?Prostate cancer screening. Recommendations will vary depending on your family history and other risks. ?Hepatitis C blood test. ?Hepatitis B blood test. ?Sexually transmitted disease (STD) testing. ?Diabetes screening. This is done by checking your blood sugar (glucose) after you have not eaten for a while (fasting). You may have this done every 1-3 years. ?Abdominal aortic aneurysm (AAA) screening. You may need  this if you are a current or former smoker. ?Osteoporosis. You may be screened starting at age 57 if you are at high risk. ?Talk with your health care provider about your test results, treatment options, and if necessary, the need for more tests. ?Vaccines  ?Your health care provider may recommend certain vaccines, such as: ?Influenza vaccine. This is recommended every  year. ?Tetanus, diphtheria, and acellular pertussis (Tdap, Td) vaccine. You may need a Td booster every 10 years. ?Zoster vaccine. You may need this after age 12. ?Pneumococcal 13-valent conjugate (PCV13) vaccine. One dose is recommended after age 21. ?Pneumococcal polysaccharide (PPSV23) vaccine. One dose is recommended after age 24. ?Talk to your health care provider about which screenings and vaccines you need and how often you need them. ?This information is not intended to replace advice given to you by your health care provider. Make sure you discuss any questions you have with your health care provider. ?Document Released: 07/16/2015 Document Revised: 03/08/2016 Document Reviewed: 04/20/2015 ?Elsevier Interactive Patient Education ? 2017 [place]. ? ?Fall Prevention in the Home ?Falls can cause injuries. They can happen to people of all ages. There are many things you can do to make your home safe and to help prevent falls. ?What can I do on the outside of my home? ?Regularly fix the edges of walkways and driveways and fix any cracks. ?Remove anything that might make you trip as you walk through a door, such as a raised step or threshold. ?Trim any bushes or trees on the path to your home. ?Use bright outdoor lighting. ?Clear any walking paths of anything that might make someone trip, such as rocks or tools. ?Regularly check to see if handrails are loose or broken. Make sure that both sides of any steps have handrails. ?Any raised decks and porches should have guardrails on the edges. ?Have any leaves, snow, or ice cleared regularly. ?Use sand or salt on walking paths during winter. ?Clean up any spills in your garage right away. This includes oil or grease spills. ?What can I do in the bathroom? ?Use night lights. ?Install grab bars by the toilet and in the tub and shower. Do not use towel bars as grab bars. ?Use non-skid mats or decals in the tub or shower. ?If you need to sit down in the shower, use a  plastic, non-slip stool. ?Keep the floor dry. Clean up any water that spills on the floor as soon as it happens. ?Remove soap buildup in the tub or shower regularly. ?Attach bath mats securely with double-sided non-slip rug tape. ?Do not have throw rugs and other things on the floor that can make you trip. ?What can I do in the bedroom? ?Use night lights. ?Make sure that you have a light by your bed that is easy to reach. ?Do not use any sheets or blankets that are too big for your bed. They should not hang down onto the floor. ?Have a firm chair that has side arms. You can use this for support while you get dressed. ?Do not have throw rugs and other things on the floor that can make you trip. ?What can I do in the kitchen? ?Clean up any spills right away. ?Avoid walking on wet floors. ?Keep items that you use a lot in easy-to-reach places. ?If you need to reach something above you, use a strong step stool that has a grab bar. ?Keep electrical cords out of the way. ?Do not use floor polish or wax that makes floors  slippery. If you must use wax, use non-skid floor wax. ?Do not have throw rugs and other things on the floor that can make you trip. ?What can I do with my stairs? ?Do not leave any items on the stairs. ?Make sure that there are handrails on both sides of the stairs and use them. Fix handrails that are broken or loose. Make sure that handrails are as long as the stairways. ?Check any carpeting to make sure that it is firmly attached to the stairs. Fix any carpet that is loose or worn. ?Avoid having throw rugs at the top or bottom of the stairs. If you do have throw rugs, attach them to the floor with carpet tape. ?Make sure that you have a light switch at the top of the stairs and the bottom of the stairs. If you do not have them, ask someone to add them for you. ?What else can I do to help prevent falls? ?Wear shoes that: ?Do not have high heels. ?Have rubber bottoms. ?Are comfortable and fit you  well. ?Are closed at the toe. Do not wear sandals. ?If you use a stepladder: ?Make sure that it is fully opened. Do not climb a closed stepladder. ?Make sure that both sides of the stepladder are locked into place

## 2022-03-29 DIAGNOSIS — H524 Presbyopia: Secondary | ICD-10-CM | POA: Diagnosis not present

## 2022-03-29 DIAGNOSIS — H52223 Regular astigmatism, bilateral: Secondary | ICD-10-CM | POA: Diagnosis not present

## 2022-03-29 DIAGNOSIS — H2513 Age-related nuclear cataract, bilateral: Secondary | ICD-10-CM | POA: Diagnosis not present

## 2022-03-29 DIAGNOSIS — H5203 Hypermetropia, bilateral: Secondary | ICD-10-CM | POA: Diagnosis not present

## 2022-03-31 ENCOUNTER — Telehealth: Payer: Self-pay | Admitting: Family Medicine

## 2022-03-31 NOTE — Telephone Encounter (Signed)
Patient dropped off handicap placard paperwork to be filled out. Patient would like to pickup in person when completed.      Paperwork placed in folder for completion        Please advise

## 2022-03-31 NOTE — Telephone Encounter (Signed)
Pt handicap placard has been received and placed in Dr Sarajane Jews red folder for completing

## 2022-04-04 NOTE — Telephone Encounter (Signed)
Pt picked up Placard form from the office this afternoon, Given to pt by [facility]

## 2022-04-04 NOTE — Telephone Encounter (Signed)
Left detailed message for pt to pick up Placard at the office

## 2022-06-21 ENCOUNTER — Ambulatory Visit (INDEPENDENT_AMBULATORY_CARE_PROVIDER_SITE_OTHER): Payer: Medicare Other | Admitting: Family Medicine

## 2022-06-21 ENCOUNTER — Encounter: Payer: Self-pay | Admitting: Family Medicine

## 2022-06-21 VITALS — BP 108/62 | HR 54 | Temp 98.1°F | Ht 69.5 in | Wt 170.6 lb

## 2022-06-21 DIAGNOSIS — E785 Hyperlipidemia, unspecified: Secondary | ICD-10-CM | POA: Diagnosis not present

## 2022-06-21 DIAGNOSIS — E119 Type 2 diabetes mellitus without complications: Secondary | ICD-10-CM | POA: Diagnosis not present

## 2022-06-21 DIAGNOSIS — N401 Enlarged prostate with lower urinary tract symptoms: Secondary | ICD-10-CM | POA: Diagnosis not present

## 2022-06-21 DIAGNOSIS — I2510 Atherosclerotic heart disease of native coronary artery without angina pectoris: Secondary | ICD-10-CM

## 2022-06-21 DIAGNOSIS — M1000 Idiopathic gout, unspecified site: Secondary | ICD-10-CM | POA: Diagnosis not present

## 2022-06-21 DIAGNOSIS — I10 Essential (primary) hypertension: Secondary | ICD-10-CM | POA: Diagnosis not present

## 2022-06-21 DIAGNOSIS — N138 Other obstructive and reflux uropathy: Secondary | ICD-10-CM | POA: Diagnosis not present

## 2022-06-21 LAB — BASIC METABOLIC PANEL
BUN: 19 mg/dL (ref 6–23)
CO2: 31 mEq/L (ref 19–32)
Calcium: 9.9 mg/dL (ref 8.4–10.5)
Chloride: 103 mEq/L (ref 96–112)
Creatinine, Ser: 1.12 mg/dL (ref 0.40–1.50)
GFR: 63.63 mL/min (ref >60.00)
Glucose, Bld: 102 mg/dL — ABNORMAL HIGH (ref 70–99)
Potassium: 4.1 mEq/L (ref 3.5–5.1)
Sodium: 141 mEq/L (ref 135–145)

## 2022-06-21 LAB — TSH: TSH: 2.37 u[IU]/mL (ref 0.35–5.50)

## 2022-06-21 LAB — CBC WITH DIFFERENTIAL/PLATELET
Basophils Absolute: 0 10*3/uL (ref 0.0–0.1)
Basophils Relative: 0.6 % (ref 0.0–3.0)
Eosinophils Absolute: 0.2 10*3/uL (ref 0.0–0.7)
Eosinophils Relative: 2.6 % (ref 0.0–5.0)
HCT: 43.9 % (ref 39.0–52.0)
Hemoglobin: 14.9 g/dL (ref 13.0–17.0)
Lymphocytes Relative: 17.4 % (ref 12.0–46.0)
Lymphs Abs: 1.3 10*3/uL (ref 0.7–4.0)
MCHC: 34.1 g/dL (ref 30.0–36.0)
MCV: 89.6 fl (ref 78.0–100.0)
Monocytes Absolute: 0.9 10*3/uL (ref 0.1–1.0)
Monocytes Relative: 12.2 % — ABNORMAL HIGH (ref 3.0–12.0)
Neutro Abs: 5 10*3/uL (ref 1.4–7.7)
Neutrophils Relative %: 67.2 % (ref 43.0–77.0)
Platelets: 180 10*3/uL (ref 150.0–400.0)
RBC: 4.9 Mil/uL (ref 4.22–5.81)
RDW: 14.5 % (ref 11.5–15.5)
WBC: 7.5 10*3/uL (ref 4.0–10.5)

## 2022-06-21 LAB — HEMOGLOBIN A1C: Hgb A1c MFr Bld: 5.2 % (ref 4.6–6.5)

## 2022-06-21 LAB — LIPID PANEL
Cholesterol: 117 mg/dL (ref 0–200)
HDL: 41.8 mg/dL (ref >39.00)
LDL Cholesterol: 62 mg/dL (ref 0–99)
NonHDL: 75.03
Total CHOL/HDL Ratio: 3
Triglycerides: 65 mg/dL (ref 0.0–149.0)
VLDL: 13 mg/dL (ref 0.0–40.0)

## 2022-06-21 LAB — HEPATIC FUNCTION PANEL
ALT: 17 U/L (ref 0–53)
AST: 21 U/L (ref 0–37)
Albumin: 4.6 g/dL (ref 3.5–5.2)
Alkaline Phosphatase: 58 U/L (ref 39–117)
Bilirubin, Direct: 0.2 mg/dL (ref 0.0–0.3)
Total Bilirubin: 0.9 mg/dL (ref 0.2–1.2)
Total Protein: 6.9 g/dL (ref 6.0–8.3)

## 2022-06-21 LAB — PSA: PSA: 0.73 ng/mL (ref 0.10–4.00)

## 2022-06-21 LAB — URIC ACID: Uric Acid, Serum: 5.3 mg/dL (ref 4.0–7.8)

## 2022-06-21 MED ORDER — ROSUVASTATIN CALCIUM 10 MG PO TABS: 10.0000 mg | ORAL_TABLET | Freq: Every day | ORAL | 3 refills | Status: DC

## 2022-06-21 MED ORDER — METOPROLOL TARTRATE 50 MG PO TABS: 50.0000 mg | ORAL_TABLET | Freq: Two times a day (BID) | ORAL | 3 refills | Status: DC

## 2022-06-21 MED ORDER — ALLOPURINOL 300 MG PO TABS: 300.0000 mg | ORAL_TABLET | Freq: Every day | ORAL | 3 refills | Status: DC

## 2022-06-21 MED ORDER — NAPROXEN 500 MG PO TABS: 500.0000 mg | ORAL_TABLET | Freq: Two times a day (BID) | ORAL | 3 refills | Status: DC

## 2022-06-21 NOTE — Progress Notes (Signed)
Subjective:    Patient ID: Kevin Mcbride, male    DOB: [birth year], 76 y.o.   MRN: [id number]  HPI Here to follow up issues. He feels great. He goes to the gym 3 days a week. His BP at home averages 120's over 70's. He saw Dr. Burt Knack in March, and he was pleased at his cardiologic status. He has not had a gout attack in years.    Review of Systems  Constitutional: Negative.   HENT: Negative.    Eyes: Negative.   Respiratory: Negative.    Cardiovascular: Negative.   Gastrointestinal: Negative.   Genitourinary: Negative.   Musculoskeletal: Negative.   Skin: Negative.   Neurological: Negative.   Psychiatric/Behavioral: Negative.         Objective:   Physical Exam Constitutional:      General: He is not in acute distress.    Appearance: Normal appearance. He is well-developed. He is not diaphoretic.  HENT:     Head: Normocephalic and atraumatic.     Right Ear: External ear normal.     Left Ear: External ear normal.     Nose: Nose normal.     Mouth/Throat:     Pharynx: No oropharyngeal exudate.  Eyes:     General: No scleral icterus.       Right eye: No discharge.        Left eye: No discharge.     Conjunctiva/sclera: Conjunctivae normal.     Pupils: Pupils are equal, round, and reactive to light.  Neck:     Thyroid: No thyromegaly.     Vascular: No JVD.     Trachea: No tracheal deviation.  Cardiovascular:     Rate and Rhythm: Normal rate and regular rhythm.     Heart sounds: Normal heart sounds. No murmur heard.    No friction rub. No gallop.  Pulmonary:     Effort: Pulmonary effort is normal. No respiratory distress.     Breath sounds: Normal breath sounds. No wheezing or rales.  Chest:     Chest wall: No tenderness.  Abdominal:     General: Bowel sounds are normal. There is no distension.     Palpations: Abdomen is soft. There is no mass.     Tenderness: There is no abdominal tenderness. There is no guarding or rebound.  Genitourinary:    Penis: Normal.  No tenderness.      Testes: Normal.  Musculoskeletal:        General: No tenderness. Normal range of motion.     Cervical back: Neck supple.  Lymphadenopathy:     Cervical: No cervical adenopathy.  Skin:    General: Skin is warm and dry.     Coloration: Skin is not pale.     Findings: No erythema or rash.  Neurological:     Mental Status: He is alert and oriented to person, place, and time.     Cranial Nerves: No cranial nerve deficit.     Motor: No abnormal muscle tone.     Coordination: Coordination normal.     Deep Tendon Reflexes: Reflexes are normal and symmetric. Reflexes normal.  Psychiatric:        Behavior: Behavior normal.        Thought Content: Thought content normal.        Judgment: Judgment normal.           Assessment & Plan:  His CAD and HTN are stable. His gout is well controlled. We will get fasting  labs today to check lipids, an A1c, etc. We spent a total of ( 31 ) minutes reviewing records and discussing these issues.  Alysia Penna, MD

## 2022-07-10 ENCOUNTER — Encounter: Payer: Self-pay | Admitting: Family Medicine

## 2022-07-10 DIAGNOSIS — H9193 Unspecified hearing loss, bilateral: Secondary | ICD-10-CM

## 2022-07-12 DIAGNOSIS — H9193 Unspecified hearing loss, bilateral: Secondary | ICD-10-CM | POA: Insufficient documentation

## 2022-07-12 NOTE — Telephone Encounter (Signed)
I would be happy to resend the referral, but remind me what this was about

## 2022-07-12 NOTE — Telephone Encounter (Signed)
I did the referral 

## 2022-10-05 DIAGNOSIS — H6123 Impacted cerumen, bilateral: Secondary | ICD-10-CM | POA: Diagnosis not present

## 2022-10-09 DIAGNOSIS — H90A22 Sensorineural hearing loss, unilateral, left ear, with restricted hearing on the contralateral side: Secondary | ICD-10-CM | POA: Diagnosis not present

## 2022-10-09 DIAGNOSIS — H90A31 Mixed conductive and sensorineural hearing loss, unilateral, right ear with restricted hearing on the contralateral side: Secondary | ICD-10-CM | POA: Diagnosis not present

## 2022-10-31 ENCOUNTER — Telehealth: Payer: Self-pay | Admitting: Family Medicine

## 2022-10-31 NOTE — Telephone Encounter (Signed)
Contacted Kevin Mcbride to schedule their annual wellness visit. Appointment made for 11/07/22.  Kevin Mcbride AWV direct phone # [phone]   Due to a schedule change, we have had to reschedule your medicare wellness visit time on 11/07/22 from  1:30  to 340 by phone  Lm and also sent my chart message with time change

## 2022-11-07 ENCOUNTER — Ambulatory Visit (INDEPENDENT_AMBULATORY_CARE_PROVIDER_SITE_OTHER): Payer: Medicare Other | Admitting: Family Medicine

## 2022-11-07 DIAGNOSIS — Z0000 Encounter for general adult medical examination without abnormal findings: Secondary | ICD-10-CM

## 2022-11-07 NOTE — Patient Instructions (Signed)
I really enjoyed getting to talk with you today! I am available on Tuesdays and Thursdays for virtual visits if you have any questions or concerns, or if I can be of any further assistance.   CHECKLIST FROM ANNUAL WELLNESS VISIT:  -Follow up (please call to schedule if not scheduled after visit):   -yearly for annual wellness visit with primary care office  Here is a list of your preventive care/health maintenance measures and the plan for each if any are due:  PLAN For any measures below that may be due:  -get your covid booster yearly at the pharmacy -can request hepatitis c screening in with labs if you wish  Health Maintenance  Topic Date Due   Hepatitis C Screening  Never done   COVID-19 Vaccine (3 - 2023-24 season) 07/03/2023 (Originally 03/03/2022)   Diabetic kidney evaluation - Urine ACR  07/04/2027 (Originally 04/01/2016)   INFLUENZA VACCINE  02/01/2023   COLONOSCOPY (Pts 45-77yrs Insurance coverage will need to be confirmed)  03/24/2023   Diabetic kidney evaluation - eGFR measurement  06/22/2023   Medicare Annual Wellness (AWV)  11/07/2023   Pneumonia Vaccine 40+ Years old  Completed   HPV VACCINES  Aged Out   DTaP/Tdap/Td  Discontinued   Zoster Vaccines- Shingrix  Discontinued    -See a dentist at least yearly  -Get your eyes checked and then per your eye specialist's recommendations  -Other issues addressed today:   -I have included below further information regarding a healthy whole foods based diet, physical activity guidelines for adults, stress management and opportunities for social connections. I hope you find this information useful.   -----------------------------------------------------------------------------------------------------------------------------------------------------------------------------------------------------------------------------------------------------------  NUTRITION: -eat real food: lots of colorful vegetables (half the plate) and  fruits -5-7 servings of vegetables and fruits per day (fresh or steamed is best), exp. 2 servings of vegetables with lunch and dinner and 2 servings of fruit per day. Berries and greens such as kale and collards are great choices.  -consume on a regular basis: whole grains (make sure first ingredient on label contains the word "whole"), fresh fruits, fish, nuts, seeds, healthy oils (such as olive oil, avocado oil, grape seed oil) -may eat small amounts of dairy and lean meat on occasion, but avoid processed meats such as ham, bacon, lunch meat, etc. -drink water -try to avoid fast food and pre-packaged foods, processed meat -most experts advise limiting sodium to < 2300mg  per day, should limit further is any chronic conditions such as high blood pressure, heart disease, diabetes, etc. The American Heart Association advised that < 1500mg  is is ideal -try to avoid foods that contain any ingredients with names you do not recognize  -try to avoid sugar/sweets (except for the natural sugar that occurs in fresh fruit) -try to avoid sweet drinks -try to avoid white rice, white bread, pasta (unless whole grain), white or yellow potatoes  EXERCISE GUIDELINES FOR ADULTS: -if you wish to increase your physical activity, do so gradually and with the approval of your doctor -STOP and seek medical care immediately if you have any chest pain, chest discomfort or trouble breathing when starting or increasing exercise  -move and stretch your body, legs, feet and arms when sitting for long periods -Physical activity guidelines for optimal health in adults: -least 150 minutes per week of aerobic exercise (can talk, but not sing) once approved by your doctor, 20-30 minutes of sustained activity or two 10 minute episodes of sustained activity every day.  -resistance training at least 2 days per  week if approved by your doctor -balance exercises 3+ days per week:   Stand somewhere where you have something sturdy to  hold onto if you lose balance.    1) lift up on toes, start with 5x per day and work up to 20x   2) stand and lift on leg straight out to the side so that foot is a few inches of the floor, start with 5x each side and work up to 20x each side   3) stand on one foot, start with 5 seconds each side and work up to 20 seconds on each side  If you need ideas or help with getting more active:  -Silver sneakers https://tools.silversneakers.com  -Walk with a Doc: http://stephens-thompson.biz/  -try to include resistance (weight lifting/strength building) and balance exercises twice per week: or the following link for ideas: ChessContest.fr  UpdateClothing.com.cy  STRESS MANAGEMENT: -can try meditating, or just sitting quietly with deep breathing while intentionally relaxing all parts of your body for 5 minutes daily -if you need further help with stress, anxiety or depression please follow up with your primary doctor or contact the wonderful folks at [facility]: [place]: -options in [place] if you wish to engage in more social and exercise related activities:  -Silver sneakers https://tools.silversneakers.com  -Walk with a Doc: http://stephens-thompson.biz/  -Check out the [place] 50+ section on the [place] of [employer] (hiking clubs, book clubs, cards and games, chess, exercise classes, aquatic classes and much more) - see the website for details: https://www.[place].gov/departments/parks-recreation/active-adults50  -YouTube has lots of exercise videos for different ages and abilities as well  -[place] (a variety of indoor and outdoor inperson activities for adults). [phone]. [address]  -Virtual Online Classes (a variety of topics): see seniorplanet.org or call [phone]  -consider volunteering  at a school, hospice center, church, senior center or elsewhere

## 2022-11-07 NOTE — Progress Notes (Signed)
PATIENT CHECK-IN and HEALTH RISK ASSESSMENT QUESTIONNAIRE:  -completed by phone/video for upcoming Medicare Preventive Visit  Pre-Visit Check-in: 1)Vitals (height, wt, BP, etc) - record in vitals section for visit on day of visit 2)Review and Update Medications, Allergies PMH, Surgeries, Social history in Epic 3)Hospitalizations in the last year with date/reason? No   4)Review and Update Care Team (patient's specialists) in Epic 5) Complete PHQ9 in Epic  6) Complete Fall Screening in Epic 7)Review all Health Maintenance Due and order under PCP if not done.  Medicare Wellness Patient Questionnaire:  Answer theses question about your habits: Do you drink alcohol? yes If yes, how many drinks do you have a day? 1` Have you ever smoked? No  Quit date if applicable? N/a   How many packs a day do/did you smoke? N/a Do you use smokeless tobacco? No  Do you use an illicit drugs? No  Do you exercises? Yes IF so, what type and how many days/minutes per week? 6-7 days a week, goes to the gym - treadmill, machines, body wight exercise, feels balance is good - does some balance exercises too Are you sexually active? Sometimes Number of partners? 1 They try to eat a healthy diet - eats lots of fresh fruits Fruits and nuts for snacks Typical breakfast varies -eggs and whole wheat toast, or bagel and cream cheese Typical lunch sandwich  Typical dinner veg, salad, meat Typical snacks: almonds or peanuts   Beverages: water and coffee  Answer theses question about you: Can you perform most household chores? Yes  Do you find it hard to follow a conversation in a noisy room? No  Do you often ask people to speak up or repeat themselves? No, it depends. Has some hearing loss mild Do you feel that you have a problem with memory? No  Do you balance your checkbook and or bank acounts? Yes  Do you feel safe at home? Yes  Last dentist visit? 60-90 days ago  Do you need assistance with any of the following:  Please note if so  no   Driving?  Feeding yourself?  Getting from bed to chair?  Getting to the toilet?  Bathing or showering?  Dressing yourself?  Managing money?  Climbing a flight of stairs  Preparing meals?    Do you have Advanced Directives in place (Living Will, Healthcare Power or Attorney)?  Does have will    Last eye Exam and location? 4-5 months ago    Do you currently use prescribed or non-prescribed narcotic or opioid pain medications? No   Do you have a history or close family history of breast, ovarian, tubal or peritoneal cancer or a family member with BRCA (breast cancer susceptibility 1 and 2) gene mutations? No   Nurse/Assistant Credentials/time stamp: Tora Perches -CMA 3:42 pm    ----------------------------------------------------------------------------------------------------------------------------------------------------------------------------------------------------------------------    MEDICARE ANNUAL PREVENTIVE CARE VISIT WITH PROVIDER (Welcome to Medicare, initial annual wellness or annual wellness exam)  Virtual Visit via Phone Note  I connected with Kevin Mcbride on 11/07/22  by phone  and verified that I am speaking with the correct person using two identifiers.  Location patient: home Location provider:work or home office Persons participating in the virtual visit: patient, provider  Concerns and/or follow up today: reports all is good.    See HM section in Epic for other details of completed HM.    ROS: negative for report of fevers, unintentional weight loss, vision changes, vision loss, hearing loss or change, chest pain, sob,  hemoptysis, melena, hematochezia, hematuria, falls, bleeding or bruising, thoughts of suicide or self harm, memory loss  Patient-completed extensive health risk assessment - reviewed and discussed with the patient: See Health Risk Assessment completed with patient prior to the visit either above or in recent  phone note. This was reviewed in detailed with the patient today and appropriate recommendations, orders and referrals were placed as needed per Summary below and patient instructions.   Review of Medical History: -PMH, PSH, Family History and current specialty and care providers reviewed and updated and listed below   Patient Care Team: Nelwyn Salisbury, MD as PCP - General (Family Medicine) Tonny Bollman, MD as Consulting Physician (Cardiology)   Past Medical History:  Diagnosis Date   CAD (coronary artery disease)    post CABG.  RBB. no symptomatic concerns, sees Dr. Tonny Bollman    Diabetes mellitus    one time elevated blood sugar   DJD (degenerative joint disease)    neck   ED (erectile dysfunction)    Gout    Hyperlipidemia    Hypertension    Myocardial infarct (HCC) 07/03/06   Vitiligo    sees Dr. Brigid Re     Past Surgical History:  Procedure Laterality Date   cardiac bypass  2009   triple   COLONOSCOPY  03/23/2016   per Dr. Russella Dar,  adenomatous polyp, repeat in 7 yrs    Social History   Socioeconomic History   Marital status: Married    Spouse name: Not on file   Number of children: Not on file   Years of education: Not on file   Highest education level: Not on file  Occupational History   Not on file  Tobacco Use   Smoking status: Never   Smokeless tobacco: Never  Substance and Sexual Activity   Alcohol use: Yes    Alcohol/week: 2.0 standard drinks of alcohol    Types: 2 Glasses of wine per week   Drug use: No   Sexual activity: Not on file  Other Topics Concern   Not on file  Social History Narrative   Not on file   Social Determinants of Health   Financial Resource Strain: Low Risk  (11/04/2021)   Overall Financial Resource Strain (CARDIA)    Difficulty of Paying Living Expenses: Not hard at all  Food Insecurity: No Food Insecurity (11/04/2021)   Hunger Vital Sign    Worried About Running Out of Food in the Last Year: Never true    Ran  Out of Food in the Last Year: Never true  Transportation Needs: No Transportation Needs (11/04/2021)   PRAPARE - Administrator, Civil Service (Medical): No    Lack of Transportation (Non-Medical): No  Physical Activity: Sufficiently Active (11/04/2021)   Exercise Vital Sign    Days of Exercise per Week: 5 days    Minutes of Exercise per Session: 130 min  Stress: No Stress Concern Present (11/04/2021)   [employer] of Occupational Health - Occupational Stress Questionnaire    Feeling of Stress : Not at all  Social Connections: Socially Integrated (11/04/2021)   Social Connection and Isolation Panel [NHANES]    Frequency of Communication with Friends and Family: More than three times a week    Frequency of Social Gatherings with Friends and Family: More than three times a week    Attends Religious Services: More than 4 times per year    Active Member of Clubs or Organizations: Yes    Attends  Club or Organization Meetings: More than 4 times per year    Marital Status: Married  Catering manager Violence: Not At Risk (11/04/2021)   Humiliation, Afraid, Rape, and Kick questionnaire    Fear of Current or Ex-Partner: No    Emotionally Abused: No    Physically Abused: No    Sexually Abused: No    Family History  Problem Relation Age of Onset   Alcohol abuse Other    Hypertension Other    Cancer Other        lung   Stroke Other    Heart disease Other    Colon cancer Neg Hx    Colon polyps Neg Hx    Rectal cancer Neg Hx    Stomach cancer Neg Hx     Current Outpatient Medications on File Prior to Visit  Medication Sig Dispense Refill   allopurinol (ZYLOPRIM) 300 MG tablet Take 1 tablet (300 mg total) by mouth daily. 90 tablet 3   aspirin 81 MG tablet Take 81 mg by mouth daily.       metoprolol tartrate (LOPRESSOR) 50 MG tablet Take 1 tablet (50 mg total) by mouth 2 (two) times daily. 180 tablet 3   Multiple Vitamins-Minerals (CENTRUM PO) Take 1 tablet by mouth daily.       naproxen (NAPROSYN) 500 MG tablet Take 1 tablet (500 mg total) by mouth 2 (two) times daily with a meal. 180 tablet 3   rosuvastatin (CRESTOR) 10 MG tablet Take 1 tablet (10 mg total) by mouth daily. 90 tablet 3   Current Facility-Administered Medications on File Prior to Visit  Medication Dose Route Frequency Provider Last Rate Last Admin   0.9 %  sodium chloride infusion  500 mL Intravenous Continuous Meryl Dare, MD        No Known Allergies     Physical Exam There were no vitals filed for this visit. Estimated body mass index is 24.83 kg/m as calculated from the following:   Height as of 06/21/22: 5' 9.5" (1.765 m).   Weight as of 06/21/22: 170 lb 9.6 oz (77.4 kg).  EKG (optional): deferred due to virtual visit  GENERAL: alert, oriented, no acute distress detected; full vision exam deferred due to pandemic and/or virtual encounter  PSYCH/NEURO: pleasant and cooperative, no obvious depression or anxiety, speech and thought processing grossly intact, Cognitive function grossly intact  Flowsheet Row Office Visit [facility] in [facility]  PHQ-9 Total Score 6           11/07/2022    3:35 PM 11/04/2021    3:14 PM 06/20/2021    2:30 PM 06/20/2021    8:46 AM 05/13/2018    8:28 AM  Depression screen PHQ 2/9  Decreased Interest 0 0 1 0 0  Down, Depressed, Hopeless 0 0 1 0 0  PHQ - 2 Score 0 0 2 0 0  Altered sleeping   1 1   Tired, decreased energy   1 0   Change in appetite   1 1   Feeling bad or failure about yourself    1 0   Trouble concentrating   0 0   Moving slowly or fidgety/restless   0 0   Suicidal thoughts   0 0   PHQ-9 Score   6 2   Difficult doing work/chores    Not difficult at all        06/20/2021    8:46 AM 06/20/2021    2:29  PM 11/04/2021    3:17 PM 11/06/2022    9:08 PM 11/07/2022    3:35 PM  Fall Risk  Falls in the past year? 1 1 0 0 0  Was there an injury with Fall? 0 0 0  0  Fall Risk Category Calculator 2  1 0  0  Fall Risk Category (Retired) Moderate Low Low    (RETIRED) Patient Fall Risk Level Moderate fall risk Moderate fall risk Low fall risk    Patient at Risk for Falls Due to   No Fall Risks  No Fall Risks  Fall risk Follow up  Falls evaluation completed   Falls evaluation completed     SUMMARY AND PLAN:  Encounter for Medicare annual wellness exam    Discussed applicable health maintenance/preventive health measures and advised and referred or ordered per patient preferences: -he will have his repeat colonoscopy this year per his report -discussed covid19 vaccine rec and hep C screening and how to do if/when he wishes  Health Maintenance  Topic Date Due   Hepatitis C Screening  Never done   COVID-19 Vaccine (3 - 2023-24 season) 07/03/2023 (Originally 03/03/2022)   Diabetic kidney evaluation - Urine ACR  07/04/2027 (Originally 04/01/2016)   INFLUENZA VACCINE  02/01/2023   COLONOSCOPY (Pts 45-38yrs Insurance coverage will need to be confirmed)  03/24/2023   Diabetic kidney evaluation - eGFR measurement  06/22/2023   Medicare Annual Wellness (AWV)  11/07/2023   Pneumonia Vaccine 10+ Years old  Completed   HPV VACCINES  Aged Out   DTaP/Tdap/Td  Discontinued   Zoster Vaccines- Shingrix  Discontinued    Education and counseling on the following was provided based on the above review of health and a plan/checklist for the patient, along with additional information discussed, was provided for the patient in the patient instructions :   -Provided counseling and safe balance exercises that can be done at home to improve balance and discussed exercise guidelines for adults with include balance exercises at least 3 days per week.  -Advised and counseled on a healthy lifestyle - including the importance of a healthy diet, regular physical activity, social connections and stress management. -Reviewed patient's current diet. Advised and counseled on a whole foods based healthy diet. A summary  of a healthy diet was provided in the Patient Instructions.  -reviewed patient's current physical activity level and discussed exercise guidelines for adults. Discussed community resources and ideas for safe exercise at home to assist in meeting exercise guideline recommendations in a safe and healthy way.  -Advise yearly dental visits at minimum and regular eye exams -Advised and counseled on alcohol safe limits -discussed sleep, circadian clock, red light night lights  Follow up: see patient instructions   Patient Instructions  I really enjoyed getting to talk with you today! I am available on Tuesdays and Thursdays for virtual visits if you have any questions or concerns, or if I can be of any further assistance.   CHECKLIST FROM ANNUAL WELLNESS VISIT:  -Follow up (please call to schedule if not scheduled after visit):   -yearly for annual wellness visit with primary care office  Here is a list of your preventive care/health maintenance measures and the plan for each if any are due:  PLAN For any measures below that may be due:  -get your covid booster yearly at the pharmacy -can request hepatitis c screening in with labs if you wish  Health Maintenance  Topic Date Due   Hepatitis C Screening  Never done   COVID-19 Vaccine (3 - 2023-24 season) 07/03/2023 (Originally 03/03/2022)   Diabetic kidney evaluation - Urine ACR  07/04/2027 (Originally 04/01/2016)   INFLUENZA VACCINE  02/01/2023   COLONOSCOPY (Pts 45-58yrs Insurance coverage will need to be confirmed)  03/24/2023   Diabetic kidney evaluation - eGFR measurement  06/22/2023   Medicare Annual Wellness (AWV)  11/07/2023   Pneumonia Vaccine 34+ Years old  Completed   HPV VACCINES  Aged Out   DTaP/Tdap/Td  Discontinued   Zoster Vaccines- Shingrix  Discontinued    -See a dentist at least yearly  -Get your eyes checked and then per your eye specialist's recommendations  -Other issues addressed today:   -I have included  below further information regarding a healthy whole foods based diet, physical activity guidelines for adults, stress management and opportunities for social connections. I hope you find this information useful.   -----------------------------------------------------------------------------------------------------------------------------------------------------------------------------------------------------------------------------------------------------------  NUTRITION: -eat real food: lots of colorful vegetables (half the plate) and fruits -5-7 servings of vegetables and fruits per day (fresh or steamed is best), exp. 2 servings of vegetables with lunch and dinner and 2 servings of fruit per day. Berries and greens such as kale and collards are great choices.  -consume on a regular basis: whole grains (make sure first ingredient on label contains the word "whole"), fresh fruits, fish, nuts, seeds, healthy oils (such as olive oil, avocado oil, grape seed oil) -may eat small amounts of dairy and lean meat on occasion, but avoid processed meats such as ham, bacon, lunch meat, etc. -drink water -try to avoid fast food and pre-packaged foods, processed meat -most experts advise limiting sodium to < 2300mg  per day, should limit further is any chronic conditions such as high blood pressure, heart disease, diabetes, etc. The American Heart Association advised that < 1500mg  is is ideal -try to avoid foods that contain any ingredients with names you do not recognize  -try to avoid sugar/sweets (except for the natural sugar that occurs in fresh fruit) -try to avoid sweet drinks -try to avoid white rice, white bread, pasta (unless whole grain), white or yellow potatoes  EXERCISE GUIDELINES FOR ADULTS: -if you wish to increase your physical activity, do so gradually and with the approval of your doctor -STOP and seek medical care immediately if you have any chest pain, chest discomfort or trouble breathing  when starting or increasing exercise  -move and stretch your body, legs, feet and arms when sitting for long periods -Physical activity guidelines for optimal health in adults: -least 150 minutes per week of aerobic exercise (can talk, but not sing) once approved by your doctor, 20-30 minutes of sustained activity or two 10 minute episodes of sustained activity every day.  -resistance training at least 2 days per week if approved by your doctor -balance exercises 3+ days per week:   Stand somewhere where you have something sturdy to hold onto if you lose balance.    1) lift up on toes, start with 5x per day and work up to 20x   2) stand and lift on leg straight out to the side so that foot is a few inches of the floor, start with 5x each side and work up to 20x each side   3) stand on one foot, start with 5 seconds each side and work up to 20 seconds on each side  If you need ideas or help with getting more active:  -Silver sneakers https://tools.silversneakers.com  -Walk with a Doc: http://www.duncan-williams.com/  -try to  include resistance (weight lifting/strength building) and balance exercises twice per week: or the following link for ideas: http://castillo-powell.com/  BuyDucts.dk  STRESS MANAGEMENT: -can try meditating, or just sitting quietly with deep breathing while intentionally relaxing all parts of your body for 5 minutes daily -if you need further help with stress, anxiety or depression please follow up with your primary doctor or contact the wonderful folks at [facility]: [phone]  SOCIAL CONNECTIONS: -options in [place] if you wish to engage in more social and exercise related activities:  -Silver sneakers https://tools.silversneakers.com  -Walk with a Doc: http://www.duncan-williams.com/  -Check out the [facility] Active Adults 50+ section on the [place] of [employer] (hiking clubs, book clubs, cards and games, chess, exercise classes, aquatic classes and much more) - see the website for details: https://www.[place].gov/departments/parks-recreation/active-adults50  -YouTube has lots of exercise videos for different ages and abilities as well  -[facility] (a variety of indoor and outdoor inperson activities for adults). [phone]. [address]  -Virtual Online Classes (a variety of topics): see seniorplanet.org or call [phone]  -consider volunteering at a school, hospice center, church, senior center or elsewhere           Terressa Koyanagi, DO

## 2023-01-08 ENCOUNTER — Ambulatory Visit: Payer: Medicare Other | Admitting: Cardiovascular Disease

## 2023-01-08 ENCOUNTER — Encounter: Payer: Self-pay | Admitting: Cardiovascular Disease

## 2023-01-08 VITALS — BP 152/78 | HR 57 | Ht 70.0 in | Wt 175.0 lb

## 2023-01-08 DIAGNOSIS — E782 Mixed hyperlipidemia: Secondary | ICD-10-CM | POA: Diagnosis not present

## 2023-01-08 DIAGNOSIS — R030 Elevated blood-pressure reading, without diagnosis of hypertension: Secondary | ICD-10-CM | POA: Diagnosis not present

## 2023-01-08 DIAGNOSIS — I2510 Atherosclerotic heart disease of native coronary artery without angina pectoris: Secondary | ICD-10-CM

## 2023-01-08 NOTE — Progress Notes (Signed)
Cardiology Office Note:    Date:  01/08/2023   ID:  Kevin Mcbride, DOB [birth year], MRN [id number]  PCP:  Nelwyn Salisbury, MD   [facility] Providers Cardiologist:  None     Referring MD: Nelwyn Salisbury, MD   Chief Complaint  Patient presents with   Coronary Artery Disease    History of Present Illness:    Kevin Mcbride is a 77 y.o. male presenting for follow-up of coronary artery disease.  The patient has multivessel CAD and underwent CABG in 2007, treated with a LIMA to LAD, saphenous vein graft to diagonal, and saphenous vein graft obtuse marginal.  He had normal left ventricular function at that time.  He has done very well since then, with no recurrent ischemic events or heart failure.  The patient is here alone today.  He continues to exercise regularly with no exertional limitation. He walks on the treadmill for 20-30 minutes at a 3 mph pace, then does pushups and resistance training. Today, he denies symptoms of palpitations, chest pain, shortness of breath, orthopnea, PND, lower extremity edema, dizziness, or syncope.   Past Medical History:  Diagnosis Date   CAD (coronary artery disease)    post CABG.  RBB. no symptomatic concerns, sees Dr. Tonny Bollman    Diabetes mellitus    one time elevated blood sugar   DJD (degenerative joint disease)    neck   ED (erectile dysfunction)    Gout    Hyperlipidemia    Hypertension    Myocardial infarct (HCC) 07/03/06   Vitiligo    sees Dr. Brigid Re     Past Surgical History:  Procedure Laterality Date   cardiac bypass  2009   triple   COLONOSCOPY  03/23/2016   per Dr. Russella Dar,  adenomatous polyp, repeat in 7 yrs    Current Medications: Current Meds  Medication Sig   allopurinol (ZYLOPRIM) 300 MG tablet Take 1 tablet (300 mg total) by mouth daily.   aspirin 81 MG tablet Take 81 mg by mouth daily.     metoprolol tartrate (LOPRESSOR) 50 MG tablet Take 1 tablet (50 mg total) by mouth 2 (two) times daily.    Multiple Vitamins-Minerals (CENTRUM PO) Take 1 tablet by mouth daily.    naproxen (NAPROSYN) 500 MG tablet Take 1 tablet (500 mg total) by mouth 2 (two) times daily with a meal.   rosuvastatin (CRESTOR) 10 MG tablet Take 1 tablet (10 mg total) by mouth daily.   Current Facility-Administered Medications for the 01/08/23 encounter (Office Visit) with Tonny Bollman, MD  Medication   0.9 %  sodium chloride infusion     Allergies:   Patient has no known allergies.   Social History   Socioeconomic History   Marital status: Married    Spouse name: Not on file   Number of children: Not on file   Years of education: Not on file   Highest education level: Not on file  Occupational History   Not on file  Tobacco Use   Smoking status: Never   Smokeless tobacco: Never  Substance and Sexual Activity   Alcohol use: Yes    Alcohol/week: 2.0 standard drinks of alcohol    Types: 2 Glasses of wine per week   Drug use: No   Sexual activity: Not on file  Other Topics Concern   Not on file  Social History Narrative   Not on file   Social Determinants of Health   Financial Resource Strain:  Low Risk  (11/04/2021)   Overall Financial Resource Strain (CARDIA)    Difficulty of Paying Living Expenses: Not hard at all  Food Insecurity: No Food Insecurity (11/04/2021)   Hunger Vital Sign    Worried About Running Out of Food in the Last Year: Never true    Ran Out of Food in the Last Year: Never true  Transportation Needs: No Transportation Needs (11/04/2021)   PRAPARE - Administrator, Civil Service (Medical): No    Lack of Transportation (Non-Medical): No  Physical Activity: Sufficiently Active (11/04/2021)   Exercise Vital Sign    Days of Exercise per Week: 5 days    Minutes of Exercise per Session: 130 min  Stress: No Stress Concern Present (11/04/2021)   [employer] of Occupational Health - Occupational Stress Questionnaire    Feeling of Stress : Not at all  Social Connections:  Socially Integrated (11/04/2021)   Social Connection and Isolation Panel [NHANES]    Frequency of Communication with Friends and Family: More than three times a week    Frequency of Social Gatherings with Friends and Family: More than three times a week    Attends Religious Services: More than 4 times per year    Active Member of [employer] or Organizations: Yes    Attends Engineer, structural: More than 4 times per year    Marital Status: Married     Family History: The patient's family history includes Alcohol abuse in an other family member; Cancer in an other family member; Heart disease in an other family member; Hypertension in an other family member; Stroke in an other family member. There is no history of Colon cancer, Colon polyps, Rectal cancer, or Stomach cancer.  ROS:   Please see the history of present illness.    All other systems reviewed and are negative.   EKG:   EKG Interpretation Date/Time:  Monday January 08 2023 07:57:01 EDT Ventricular Rate:  57 PR Interval:  208 QRS Duration:  168 QT Interval:  472 QTC Calculation: 459 R Axis:   -59  Text Interpretation: Sinus bradycardia with sinus arrhythmia Left axis deviation Right bundle branch block No significant change since last tracing Confirmed by Tonny Bollman [phone]) on 01/08/2023 8:05:14 AM    Recent Labs: 06/21/2022: ALT 17; BUN 19; Creatinine, Ser 1.12; Hemoglobin 14.9; Platelets 180.0; Potassium 4.1; Sodium 141; TSH 2.37  Recent Lipid Panel    Component Value Date/Time   CHOL 117 06/21/2022 0835   TRIG 65.0 06/21/2022 0835   TRIG 67 06/08/2006 1049   HDL 41.80 06/21/2022 0835   CHOLHDL 3 06/21/2022 0835   VLDL 13.0 06/21/2022 0835   LDLCALC 62 06/21/2022 0835     Risk Assessment/Calculations:      HYPERTENSION CONTROL Vitals:   01/08/23 0759 01/08/23 0828  BP: (!) 158/80 (!) 152/78    The patient's blood pressure is elevated above target today.  In order to address the patient's elevated  BP: Blood pressure will be monitored at home to determine if medication changes need to be made.            Physical Exam:    VS:  BP (!) 152/78   Pulse (!) 57   Ht 5\' 10"  (1.778 m)   Wt 175 lb (79.4 kg)   SpO2 98%   BMI 25.11 kg/m     Wt Readings from Last 3 Encounters:  01/08/23 175 lb (79.4 kg)  06/21/22 170 lb 9.6 oz (77.4 kg)  11/04/21 172 lb (78 kg)     GEN:  Well nourished, well developed in no acute distress HEENT: Normal NECK: No JVD; No carotid bruits LYMPHATICS: No lymphadenopathy CARDIAC: RRR, no murmurs, rubs, gallops RESPIRATORY:  Clear to auscultation without rales, wheezing or rhonchi  ABDOMEN: Soft, non-tender, non-distended MUSCULOSKELETAL:  No edema; No deformity  SKIN: Warm and dry NEUROLOGIC:  Alert and oriented x 3 PSYCHIATRIC:  Normal affect   ASSESSMENT:    1. Coronary artery disease involving native coronary artery of native heart without angina pectoris   2. Mixed hyperlipidemia   3. White coat syndrome without hypertension    PLAN:    In order of problems listed above:  The patient is stable with no symptoms of angina.  He maintains a very active lifestyle.  He continues on aspirin for antiplatelet therapy and a statin drug with rosuvastatin.  I will see him back in 1 year for follow-up evaluation.  He understands to call if he has any problems in the interim. On rosuvastatin 10 mg daily his cholesterol is 117, HDL 42, and LDL 62. Blood pressure elevated today.  Has been in good range on past readings and home readings.  I asked him to monitor his blood pressure a few days per week and we gave him a parameter of 135/80 mmHg to contact us if he is ranging higher than this.           Medication Adjustments/Labs and Tests Ordered: Current medicines are reviewed at length with the patient today.  Concerns regarding medicines are outlined above.  Orders Placed This Encounter  Procedures   EKG 12-Lead   No orders of the defined types  were placed in this encounter.   Patient Instructions  Medication Instructions:  Your physician recommends that you continue on your current medications as directed. Please refer to the Current Medication list given to you today.  *If you need a refill on your cardiac medications before your next appointment, please call your pharmacy*   Lab Work: NONE If you have labs (blood work) drawn today and your tests are completely normal, you will receive your results only by: MyChart Message (if you have MyChart) OR A paper copy in the mail If you have any lab test that is abnormal or we need to change your treatment, we will call you to review the results.   Testing/Procedures: **Please monitor BP at home 2-3x/week and report if BP trending greaer than 135/16mmhg**   Follow-Up: At [facility], you and your health needs are our priority.  As part of our continuing mission to provide you with exceptional heart care, we have created designated Provider Care Teams.  These Care Teams include your primary Cardiologist (physician) and Advanced Practice Providers (APPs -  Physician Assistants and Nurse Practitioners) who all work together to provide you with the care you need, when you need it.  Your next appointment:   1 year(s)  Provider:   Axel Filler     Signed, Tonny Bollman, MD  01/08/2023 8:29 AM    [facility]

## 2023-01-08 NOTE — Patient Instructions (Signed)
Medication Instructions:  Your physician recommends that you continue on your current medications as directed. Please refer to the Current Medication list given to you today.  *If you need a refill on your cardiac medications before your next appointment, please call your pharmacy*   Lab Work: NONE If you have labs (blood work) drawn today and your tests are completely normal, you will receive your results only by: MyChart Message (if you have MyChart) OR A paper copy in the mail If you have any lab test that is abnormal or we need to change your treatment, we will call you to review the results.   Testing/Procedures: **Please monitor BP at home 2-3x/week and report if BP trending greaer than 135/73mmhg**   Follow-Up: At [facility], you and your health needs are our priority.  As part of our continuing mission to provide you with exceptional heart care, we have created designated Provider Care Teams.  These Care Teams include your primary Cardiologist (physician) and Advanced Practice Providers (APPs -  Physician Assistants and Nurse Practitioners) who all work together to provide you with the care you need, when you need it.  Your next appointment:   1 year(s)  Provider:   Casimiro Needle Cooper,MD

## 2023-05-11 ENCOUNTER — Encounter: Payer: Self-pay | Admitting: Gastroenterology

## 2023-06-25 ENCOUNTER — Encounter: Payer: Self-pay | Admitting: Family Medicine

## 2023-06-25 ENCOUNTER — Ambulatory Visit (INDEPENDENT_AMBULATORY_CARE_PROVIDER_SITE_OTHER): Payer: Medicare Other | Admitting: Family Medicine

## 2023-06-25 VITALS — BP 118/64 | HR 54 | Temp 97.5°F | Ht 69.5 in | Wt 174.0 lb

## 2023-06-25 DIAGNOSIS — E782 Mixed hyperlipidemia: Secondary | ICD-10-CM | POA: Diagnosis not present

## 2023-06-25 DIAGNOSIS — I2510 Atherosclerotic heart disease of native coronary artery without angina pectoris: Secondary | ICD-10-CM

## 2023-06-25 DIAGNOSIS — N401 Enlarged prostate with lower urinary tract symptoms: Secondary | ICD-10-CM | POA: Diagnosis not present

## 2023-06-25 DIAGNOSIS — I10 Essential (primary) hypertension: Secondary | ICD-10-CM

## 2023-06-25 DIAGNOSIS — N138 Other obstructive and reflux uropathy: Secondary | ICD-10-CM

## 2023-06-25 DIAGNOSIS — E785 Hyperlipidemia, unspecified: Secondary | ICD-10-CM

## 2023-06-25 DIAGNOSIS — M1000 Idiopathic gout, unspecified site: Secondary | ICD-10-CM

## 2023-06-25 DIAGNOSIS — L80 Vitiligo: Secondary | ICD-10-CM

## 2023-06-25 DIAGNOSIS — R739 Hyperglycemia, unspecified: Secondary | ICD-10-CM | POA: Diagnosis not present

## 2023-06-25 LAB — CBC WITH DIFFERENTIAL/PLATELET
Basophils Absolute: 0 10*3/uL (ref 0.0–0.1)
Basophils Relative: 0.8 % (ref 0.0–3.0)
Eosinophils Absolute: 0.2 10*3/uL (ref 0.0–0.7)
Eosinophils Relative: 4.2 % (ref 0.0–5.0)
HCT: 42.5 % (ref 39.0–52.0)
Hemoglobin: 14.6 g/dL (ref 13.0–17.0)
Lymphocytes Relative: 24.9 % (ref 12.0–46.0)
Lymphs Abs: 1.4 10*3/uL (ref 0.7–4.0)
MCHC: 34.2 g/dL (ref 30.0–36.0)
MCV: 90.6 fL (ref 78.0–100.0)
Monocytes Absolute: 0.7 10*3/uL (ref 0.1–1.0)
Monocytes Relative: 11.8 % (ref 3.0–12.0)
Neutro Abs: 3.3 10*3/uL (ref 1.4–7.7)
Neutrophils Relative %: 58.3 % (ref 43.0–77.0)
Platelets: 166 10*3/uL (ref 150.0–400.0)
RBC: 4.7 Mil/uL (ref 4.22–5.81)
RDW: 14.8 % (ref 11.5–15.5)
WBC: 5.6 10*3/uL (ref 4.0–10.5)

## 2023-06-25 LAB — BASIC METABOLIC PANEL
BUN: 19 mg/dL (ref 6–23)
CO2: 27 meq/L (ref 19–32)
Calcium: 9.2 mg/dL (ref 8.4–10.5)
Chloride: 107 meq/L (ref 96–112)
Creatinine, Ser: 1.11 mg/dL (ref 0.40–1.50)
GFR: 63.86 mL/min (ref >60.00)
Glucose, Bld: 88 mg/dL (ref 70–99)
Potassium: 3.9 meq/L (ref 3.5–5.1)
Sodium: 142 meq/L (ref 135–145)

## 2023-06-25 LAB — LIPID PANEL
Cholesterol: 114 mg/dL (ref 0–200)
HDL: 41.6 mg/dL (ref >39.00)
LDL Cholesterol: 59 mg/dL (ref 0–99)
NonHDL: 71.9
Total CHOL/HDL Ratio: 3
Triglycerides: 67 mg/dL (ref 0.0–149.0)
VLDL: 13.4 mg/dL (ref 0.0–40.0)

## 2023-06-25 LAB — HEPATIC FUNCTION PANEL
ALT: 19 U/L (ref 0–53)
AST: 23 U/L (ref 0–37)
Albumin: 4.5 g/dL (ref 3.5–5.2)
Alkaline Phosphatase: 50 U/L (ref 39–117)
Bilirubin, Direct: 0.3 mg/dL (ref 0.0–0.3)
Total Bilirubin: 0.9 mg/dL (ref 0.2–1.2)
Total Protein: 6.8 g/dL (ref 6.0–8.3)

## 2023-06-25 LAB — PSA: PSA: 0.85 ng/mL (ref 0.10–4.00)

## 2023-06-25 LAB — TSH: TSH: 2.04 u[IU]/mL (ref 0.35–5.50)

## 2023-06-25 LAB — HEMOGLOBIN A1C: Hgb A1c MFr Bld: 5.4 % (ref 4.6–6.5)

## 2023-06-25 LAB — URIC ACID: Uric Acid, Serum: 5.1 mg/dL (ref 4.0–7.8)

## 2023-06-25 MED ORDER — ALLOPURINOL 300 MG PO TABS: 300.0000 mg | ORAL_TABLET | Freq: Every day | ORAL | 3 refills | Status: DC

## 2023-06-25 MED ORDER — METOPROLOL TARTRATE 50 MG PO TABS: 50.0000 mg | ORAL_TABLET | Freq: Two times a day (BID) | ORAL | 3 refills | Status: DC

## 2023-06-25 MED ORDER — ROSUVASTATIN CALCIUM 10 MG PO TABS: 10.0000 mg | ORAL_TABLET | Freq: Every day | ORAL | 3 refills | Status: DC

## 2023-06-25 MED ORDER — NAPROXEN 500 MG PO TABS: 500.0000 mg | ORAL_TABLET | Freq: Two times a day (BID) | ORAL | 3 refills | Status: DC

## 2023-06-25 NOTE — Progress Notes (Signed)
Subjective:    Patient ID: Kevin Mcbride, male    DOB: 1946-06-03, 77 y.o.   MRN: [id number]  HPI Here to follow up on issues. He feels well. He has not had a gout flare in years. He saw Dr. Excell Seltzer a few months ago, and he is doing well from a cardiac standpoint. His BP is stable.    Review of Systems  Constitutional: Negative.   HENT: Negative.    Eyes: Negative.   Respiratory: Negative.    Cardiovascular: Negative.   Gastrointestinal: Negative.   Genitourinary: Negative.   Musculoskeletal: Negative.   Skin: Negative.   Neurological: Negative.   Psychiatric/Behavioral: Negative.         Objective:   Physical Exam Constitutional:      General: He is not in acute distress.    Appearance: Normal appearance. He is well-developed. He is not diaphoretic.  HENT:     Head: Normocephalic and atraumatic.     Right Ear: External ear normal.     Left Ear: External ear normal.     Nose: Nose normal.     Mouth/Throat:     Pharynx: No oropharyngeal exudate.  Eyes:     General: No scleral icterus.       Right eye: No discharge.        Left eye: No discharge.     Conjunctiva/sclera: Conjunctivae normal.     Pupils: Pupils are equal, round, and reactive to light.  Neck:     Thyroid: No thyromegaly.     Vascular: No JVD.     Trachea: No tracheal deviation.  Cardiovascular:     Rate and Rhythm: Normal rate and regular rhythm.     Pulses: Normal pulses.     Heart sounds: Normal heart sounds. No murmur heard.    No friction rub. No gallop.  Pulmonary:     Effort: Pulmonary effort is normal. No respiratory distress.     Breath sounds: Normal breath sounds. No wheezing or rales.  Chest:     Chest wall: No tenderness.  Abdominal:     General: Bowel sounds are normal. There is no distension.     Palpations: Abdomen is soft. There is no mass.     Tenderness: There is no abdominal tenderness. There is no guarding or rebound.  Genitourinary:    Penis: Normal. No tenderness.       Testes: Normal.  Musculoskeletal:        General: No tenderness. Normal range of motion.     Cervical back: Neck supple.  Lymphadenopathy:     Cervical: No cervical adenopathy.  Skin:    General: Skin is warm and dry.     Coloration: Skin is not pale.     Findings: No erythema or rash.  Neurological:     General: No focal deficit present.     Mental Status: He is alert and oriented to person, place, and time.     Cranial Nerves: No cranial nerve deficit.     Motor: No abnormal muscle tone.     Coordination: Coordination normal.     Deep Tendon Reflexes: Reflexes are normal and symmetric. Reflexes normal.  Psychiatric:        Mood and Affect: Mood normal.        Behavior: Behavior normal.        Thought Content: Thought content normal.        Judgment: Judgment normal.  Assessment & Plan:  His HTN and CAD are stable. The gout is well controlled. His BPH is stable. We will get fasting labs to check lipids, etc. He is past due for a colonoscopy so he will contact the GI office. We spent a total of ( 33   ) minutes reviewing records and discussing these issues.  Gershon Crane, MD

## 2023-07-03 ENCOUNTER — Encounter: Payer: Medicare Other | Admitting: Family Medicine

## 2024-03-24 ENCOUNTER — Encounter: Payer: Self-pay | Admitting: Cardiovascular Disease

## 2024-03-24 ENCOUNTER — Ambulatory Visit: Attending: Cardiovascular Disease | Admitting: Cardiovascular Disease

## 2024-03-24 VITALS — BP 130/80 | HR 58 | Ht 70.0 in | Wt 175.6 lb

## 2024-03-24 DIAGNOSIS — I2510 Atherosclerotic heart disease of native coronary artery without angina pectoris: Secondary | ICD-10-CM

## 2024-03-24 DIAGNOSIS — E782 Mixed hyperlipidemia: Secondary | ICD-10-CM | POA: Diagnosis not present

## 2024-03-24 NOTE — Progress Notes (Signed)
 Cardiology Office Note:    Date:  03/24/2024   ID:  Kevin Mcbride, DOB 10-Sep-1945, MRN [id number]  PCP:  Kevin Garnette LABOR, Kevin Mcbride   [facility] Providers Cardiologist:  None     Referring Kevin Mcbride: Kevin Garnette LABOR, Kevin Mcbride   Chief Complaint  Patient presents with   Coronary Artery Disease    History of Present Illness:    Kevin Mcbride is a 78 y.o. male presenting for follow-up of coronary artery disease. The patient has multivessel CAD and underwent CABG in 2007, treated with a LIMA to LAD, saphenous vein graft to diagonal, and saphenous vein graft obtuse marginal. He had normal left ventricular function at that time. He has done very well since then, with no recurrent ischemic events or heart failure.   The patient is here alone today. Today, he denies symptoms of palpitations, chest pain, shortness of breath, orthopnea, PND, lower extremity edema, dizziness, or syncope.  Current Medications: Current Meds  Medication Sig   allopurinol  (ZYLOPRIM ) 300 MG tablet Take 1 tablet (300 mg total) by mouth daily.   metoprolol  tartrate (LOPRESSOR ) 50 MG tablet Take 1 tablet (50 mg total) by mouth 2 (two) times daily.   Multiple Vitamins-Minerals (CENTRUM PO) Take 1 tablet by mouth daily.    naproxen  (NAPROSYN ) 500 MG tablet Take 1 tablet (500 mg total) by mouth 2 (two) times daily with a meal. (Patient taking differently: Take 500 mg by mouth as needed.)   rosuvastatin  (CRESTOR ) 10 MG tablet Take 1 tablet (10 mg total) by mouth daily.   Current Facility-Administered Medications for the 03/24/24 encounter (Office Visit) with Kevin Sharper, Kevin Mcbride  Medication   0.9 %  sodium chloride  infusion     Allergies:   Patient has no known allergies.   ROS:   Please see the history of present illness.    All other systems reviewed and are negative.  EKGs/Labs/Other Studies Reviewed:    The following studies were reviewed today:     EKG:   EKG Interpretation Date/Time:  Monday March 24 2024 07:55:39 EDT Ventricular Rate:  58 PR Interval:  212 QRS Duration:  172 QT Interval:  478 QTC Calculation: 469 R Axis:   -61  Text Interpretation: Sinus bradycardia with 1st degree A-V block Left axis deviation Right bundle branch block When compared with ECG of 08-Jan-2023 07:57, No significant change was found Confirmed by Kevin Mcbride [phone]) on 03/24/2024 8:23:40 AM    Recent Labs: 06/25/2023: ALT 19; BUN 19; Creatinine, Ser 1.11; Hemoglobin 14.6; Platelets 166.0; Potassium 3.9; Sodium 142; TSH 2.04  Recent Lipid Panel    Component Value Date/Time   CHOL 114 06/25/2023 0932   TRIG 67.0 06/25/2023 0932   TRIG 67 06/08/2006 1049   HDL 41.60 06/25/2023 0932   CHOLHDL 3 06/25/2023 0932   VLDL 13.4 06/25/2023 0932   LDLCALC 59 06/25/2023 0932     Risk Assessment/Calculations:                Physical Exam:    VS:  BP 130/80   Pulse (!) 58   Ht 5' 10 (1.778 m)   Wt 175 lb 9.6 oz (79.7 kg)   SpO2 99%   BMI 25.20 kg/m     Wt Readings from Last 3 Encounters:  03/24/24 175 lb 9.6 oz (79.7 kg)  06/25/23 174 lb (78.9 kg)  01/08/23 175 lb (79.4 kg)     GEN:  Well nourished, well developed in no acute distress HEENT: Normal  NECK: No JVD; No carotid bruits LYMPHATICS: No lymphadenopathy CARDIAC: RRR, no murmurs, rubs, gallops RESPIRATORY:  Clear to auscultation without rales, wheezing or rhonchi  ABDOMEN: Soft, non-tender, non-distended MUSCULOSKELETAL:  No edema; No deformity  SKIN: Warm and dry NEUROLOGIC:  Alert and oriented x 3 PSYCHIATRIC:  Normal affect   Assessment & Plan Coronary artery disease involving native coronary artery of native heart without angina pectoris Asymptomatic. Continue metoprolol  and rosuvastatin . EKG unchanged form baseline. Follow-up one year.  Mixed hyperlipidemia Chol 114, HDL 42, LDL 59. ALT normal at 19. Continue rosuvastatin .             Medication Adjustments/Labs and Tests Ordered: Current medicines are reviewed  at length with the patient today.  Concerns regarding medicines are outlined above.  Orders Placed This Encounter  Procedures   EKG 12-Lead   No orders of the defined types were placed in this encounter.   Patient Instructions  Medication Instructions:  No medication changes were made at this visit. Continue current regimen.   *If you need a refill on your cardiac medications before your next appointment, please call your pharmacy*  Lab Work: None ordered today. If you have labs (blood work) drawn today and your tests are completely normal, you will receive your results only by: MyChart Message (if you have MyChart) OR A paper copy in the mail If you have any lab test that is abnormal or we need to change your treatment, we will call you to review the results.  Testing/Procedures: None ordered today.  Follow-Up: At [facility], you and your health needs are our priority.  As part of our continuing mission to provide you with exceptional heart care, our providers are all part of one team.  This team includes your primary Cardiologist (physician) and Advanced Practice Providers or APPs (Physician Assistants and Nurse Practitioners) who all work together to provide you with the care you need, when you need it.  Your next appointment:   1 year(s)  Provider:   Ozell Fell, Kevin Mcbride      Signed, Kevin Fell, Kevin Mcbride  03/24/2024 12:57 PM    [facility]

## 2024-03-24 NOTE — Assessment & Plan Note (Addendum)
 Chol 114, HDL 42, LDL 59. ALT normal at 19. Continue rosuvastatin .

## 2024-03-24 NOTE — Assessment & Plan Note (Addendum)
 Asymptomatic. Continue metoprolol  and rosuvastatin . EKG unchanged form baseline. Follow-up one year.

## 2024-03-24 NOTE — Patient Instructions (Signed)

## 2024-07-02 ENCOUNTER — Ambulatory Visit (INDEPENDENT_AMBULATORY_CARE_PROVIDER_SITE_OTHER): Payer: Medicare Other | Admitting: Family Medicine

## 2024-07-02 ENCOUNTER — Encounter: Payer: Self-pay | Admitting: Family Medicine

## 2024-07-02 VITALS — BP 118/60 | HR 53 | Temp 97.7°F | Ht 69.0 in | Wt 168.2 lb

## 2024-07-02 DIAGNOSIS — E785 Hyperlipidemia, unspecified: Secondary | ICD-10-CM

## 2024-07-02 DIAGNOSIS — M1000 Idiopathic gout, unspecified site: Secondary | ICD-10-CM | POA: Diagnosis not present

## 2024-07-02 DIAGNOSIS — I2510 Atherosclerotic heart disease of native coronary artery without angina pectoris: Secondary | ICD-10-CM

## 2024-07-02 DIAGNOSIS — N401 Enlarged prostate with lower urinary tract symptoms: Secondary | ICD-10-CM

## 2024-07-02 DIAGNOSIS — I10 Essential (primary) hypertension: Secondary | ICD-10-CM

## 2024-07-02 DIAGNOSIS — E782 Mixed hyperlipidemia: Secondary | ICD-10-CM | POA: Diagnosis not present

## 2024-07-02 DIAGNOSIS — R739 Hyperglycemia, unspecified: Secondary | ICD-10-CM

## 2024-07-02 DIAGNOSIS — N138 Other obstructive and reflux uropathy: Secondary | ICD-10-CM | POA: Diagnosis not present

## 2024-07-02 DIAGNOSIS — Z860100 Personal history of colon polyps, unspecified: Secondary | ICD-10-CM

## 2024-07-02 LAB — TSH: TSH: 2.84 u[IU]/mL (ref 0.35–5.50)

## 2024-07-02 LAB — CBC WITH DIFFERENTIAL/PLATELET
Basophils Absolute: 0 K/uL (ref 0.0–0.1)
Basophils Relative: 0.7 % (ref 0.0–3.0)
Eosinophils Absolute: 0.2 K/uL (ref 0.0–0.7)
Eosinophils Relative: 3.8 % (ref 0.0–5.0)
HCT: 42.9 % (ref 39.0–52.0)
Hemoglobin: 14.8 g/dL (ref 13.0–17.0)
Lymphocytes Relative: 34.7 % (ref 12.0–46.0)
Lymphs Abs: 1.7 K/uL (ref 0.7–4.0)
MCHC: 34.4 g/dL (ref 30.0–36.0)
MCV: 88.7 fl (ref 78.0–100.0)
Monocytes Absolute: 0.6 K/uL (ref 0.1–1.0)
Monocytes Relative: 13.5 % — ABNORMAL HIGH (ref 3.0–12.0)
Neutro Abs: 2.3 K/uL (ref 1.4–7.7)
Neutrophils Relative %: 47.3 % (ref 43.0–77.0)
Platelets: 168 K/uL (ref 150.0–400.0)
RBC: 4.84 Mil/uL (ref 4.22–5.81)
RDW: 15.9 % — ABNORMAL HIGH (ref 11.5–15.5)
WBC: 4.8 K/uL (ref 4.0–10.5)

## 2024-07-02 LAB — LIPID PANEL
Cholesterol: 111 mg/dL (ref 28–200)
HDL: 41.6 mg/dL
LDL Cholesterol: 58 mg/dL (ref 10–99)
NonHDL: 69.46
Total CHOL/HDL Ratio: 3
Triglycerides: 59 mg/dL (ref 10.0–149.0)
VLDL: 11.8 mg/dL (ref 0.0–40.0)

## 2024-07-02 LAB — BASIC METABOLIC PANEL WITH GFR
BUN: 24 mg/dL — ABNORMAL HIGH (ref 6–23)
CO2: 27 meq/L (ref 19–32)
Calcium: 9.4 mg/dL (ref 8.4–10.5)
Chloride: 105 meq/L (ref 96–112)
Creatinine, Ser: 1.14 mg/dL (ref 0.40–1.50)
GFR: 61.41 mL/min
Glucose, Bld: 99 mg/dL (ref 70–99)
Potassium: 4.2 meq/L (ref 3.5–5.1)
Sodium: 140 meq/L (ref 135–145)

## 2024-07-02 LAB — HEPATIC FUNCTION PANEL
ALT: 17 U/L (ref 3–53)
AST: 23 U/L (ref 5–37)
Albumin: 4.6 g/dL (ref 3.5–5.2)
Alkaline Phosphatase: 44 U/L (ref 39–117)
Bilirubin, Direct: 0.2 mg/dL (ref 0.1–0.3)
Total Bilirubin: 0.9 mg/dL (ref 0.2–1.2)
Total Protein: 6.8 g/dL (ref 6.0–8.3)

## 2024-07-02 LAB — PSA: PSA: 0.68 ng/mL (ref 0.10–4.00)

## 2024-07-02 LAB — URIC ACID: Uric Acid, Serum: 4.6 mg/dL (ref 4.0–7.8)

## 2024-07-02 LAB — HEMOGLOBIN A1C: Hgb A1c MFr Bld: 5.1 % (ref 4.6–6.5)

## 2024-07-02 MED ORDER — METOPROLOL TARTRATE 50 MG PO TABS: 50.0000 mg | ORAL_TABLET | Freq: Two times a day (BID) | ORAL | 3 refills | Status: AC

## 2024-07-02 MED ORDER — ROSUVASTATIN CALCIUM 10 MG PO TABS: 10.0000 mg | ORAL_TABLET | Freq: Every day | ORAL | 3 refills | Status: AC

## 2024-07-02 MED ORDER — ALLOPURINOL 300 MG PO TABS: 300.0000 mg | ORAL_TABLET | Freq: Every day | ORAL | 3 refills | Status: AC

## 2024-07-02 NOTE — Progress Notes (Signed)
 "  Subjective:    Patient ID: Kevin Mcbride, male    DOB: Feb 17, 1946, 78 y.o.   MRN: [id number]  HPI Here to follow up on issues. His CAD seems to be quite stable. He saw Dr. Wonda in September, and he was pleased with his status. His BP is stable. He has not had a gout flare for 20 years. He is urinating normally.    Review of Systems  Constitutional: Negative.   HENT: Negative.    Eyes: Negative.   Respiratory: Negative.    Cardiovascular: Negative.   Gastrointestinal: Negative.   Genitourinary: Negative.   Musculoskeletal: Negative.   Skin: Negative.   Neurological: Negative.   Psychiatric/Behavioral: Negative.         Objective:   Physical Exam Constitutional:      General: He is not in acute distress.    Appearance: Normal appearance. He is well-developed. He is not diaphoretic.  HENT:     Head: Normocephalic and atraumatic.     Right Ear: External ear normal.     Left Ear: External ear normal.     Nose: Nose normal.     Mouth/Throat:     Pharynx: No oropharyngeal exudate.  Eyes:     General: No scleral icterus.       Right eye: No discharge.        Left eye: No discharge.     Conjunctiva/sclera: Conjunctivae normal.     Pupils: Pupils are equal, round, and reactive to light.  Neck:     Thyroid : No thyromegaly.     Vascular: No JVD.     Trachea: No tracheal deviation.  Cardiovascular:     Rate and Rhythm: Normal rate and regular rhythm.     Pulses: Normal pulses.     Heart sounds: Normal heart sounds. No murmur heard.    No friction rub. No gallop.  Pulmonary:     Effort: Pulmonary effort is normal. No respiratory distress.     Breath sounds: Normal breath sounds. No wheezing or rales.  Chest:     Chest wall: No tenderness.  Abdominal:     General: Bowel sounds are normal. There is no distension.     Palpations: Abdomen is soft. There is no mass.     Tenderness: There is no abdominal tenderness. There is no guarding or rebound.  Genitourinary:     Penis: Normal. No tenderness.      Testes: Normal.  Musculoskeletal:        General: No tenderness. Normal range of motion.     Cervical back: Neck supple.  Lymphadenopathy:     Cervical: No cervical adenopathy.  Skin:    General: Skin is warm and dry.     Coloration: Skin is not pale.     Findings: No erythema or rash.  Neurological:     General: No focal deficit present.     Mental Status: He is alert and oriented to person, place, and time.     Cranial Nerves: No cranial nerve deficit.     Motor: No abnormal muscle tone.     Coordination: Coordination normal.     Deep Tendon Reflexes: Reflexes are normal and symmetric. Reflexes normal.  Psychiatric:        Mood and Affect: Mood normal.        Behavior: Behavior normal.        Thought Content: Thought content normal.        Judgment: Judgment normal.  Assessment & Plan:  His CAD and HTN are stable. His gout and BPH are well controlled. We will get labs to check lipids, a PSA, etc. Set up another colonoscopy. I personally spent a total of 33 minutes in the care of the patient today including getting/reviewing separately obtained history, performing a medically appropriate exam/evaluation, placing orders, and coordinating care.  Garnette Olmsted, MD   "

## 2024-07-04 ENCOUNTER — Ambulatory Visit: Payer: Self-pay | Admitting: Family Medicine
